# Patient Record
Sex: Male | Born: 1960 | Race: White | Hispanic: No | Marital: Married | Smoking: Never smoker
Health system: Southern US, Community
[De-identification: ages and names within clinical notes are randomized; demographics above are authoritative.]

## PROBLEM LIST (undated history)

## (undated) DIAGNOSIS — K449 Diaphragmatic hernia without obstruction or gangrene: Secondary | ICD-10-CM

## (undated) DIAGNOSIS — M1990 Unspecified osteoarthritis, unspecified site: Secondary | ICD-10-CM

## (undated) DIAGNOSIS — H269 Unspecified cataract: Secondary | ICD-10-CM

## (undated) DIAGNOSIS — I4891 Unspecified atrial fibrillation: Secondary | ICD-10-CM

## (undated) DIAGNOSIS — R000 Tachycardia, unspecified: Secondary | ICD-10-CM

## (undated) HISTORY — PX: HIATAL HERNIA REPAIR: SHX195

## (undated) HISTORY — PX: HERNIA REPAIR: SHX51

## (undated) HISTORY — DX: Tachycardia, unspecified: R00.0

## (undated) HISTORY — PX: EYE SURGERY: SHX253

## (undated) HISTORY — DX: Diaphragmatic hernia without obstruction or gangrene: K44.9

## (undated) HISTORY — DX: Unspecified osteoarthritis, unspecified site: M19.90

## (undated) HISTORY — DX: Unspecified cataract: H26.9

---

## 2018-11-23 ENCOUNTER — Ambulatory Visit: Payer: Self-pay | Admitting: Nurse Practitioner

## 2019-06-27 ENCOUNTER — Other Ambulatory Visit: Payer: Self-pay

## 2019-06-27 ENCOUNTER — Ambulatory Visit (INDEPENDENT_AMBULATORY_CARE_PROVIDER_SITE_OTHER): Payer: BC Managed Care – PPO | Admitting: Nurse Practitioner

## 2019-06-27 ENCOUNTER — Encounter: Payer: Self-pay | Admitting: Nurse Practitioner

## 2019-06-27 VITALS — BP 136/82 | HR 86 | Temp 98.0°F | Ht 72.34 in | Wt 305.6 lb

## 2019-06-27 DIAGNOSIS — B356 Tinea cruris: Secondary | ICD-10-CM | POA: Insufficient documentation

## 2019-06-27 DIAGNOSIS — Z7689 Persons encountering health services in other specified circumstances: Secondary | ICD-10-CM | POA: Diagnosis not present

## 2019-06-27 DIAGNOSIS — E669 Obesity, unspecified: Secondary | ICD-10-CM | POA: Insufficient documentation

## 2019-06-27 DIAGNOSIS — R030 Elevated blood-pressure reading, without diagnosis of hypertension: Secondary | ICD-10-CM | POA: Insufficient documentation

## 2019-06-27 DIAGNOSIS — R2243 Localized swelling, mass and lump, lower limb, bilateral: Secondary | ICD-10-CM | POA: Insufficient documentation

## 2019-06-27 DIAGNOSIS — M25561 Pain in right knee: Secondary | ICD-10-CM

## 2019-06-27 DIAGNOSIS — M25562 Pain in left knee: Secondary | ICD-10-CM

## 2019-06-27 DIAGNOSIS — G8929 Other chronic pain: Secondary | ICD-10-CM | POA: Insufficient documentation

## 2019-06-27 MED ORDER — FLUCONAZOLE 150 MG PO TABS: 150.0000 mg | ORAL_TABLET | Freq: Once | ORAL | 0 refills | Status: DC

## 2019-06-27 MED ORDER — NYSTATIN 100000 UNIT/GM EX CREA: 1.0000 "application " | TOPICAL_CREAM | Freq: Two times a day (BID) | CUTANEOUS | 1 refills | Status: DC

## 2019-06-27 MED ORDER — KETOCONAZOLE 2 % EX CREA: 1.0000 "application " | TOPICAL_CREAM | Freq: Every day | CUTANEOUS | 0 refills | Status: DC

## 2019-06-27 NOTE — Assessment & Plan Note (Addendum)
Referral to podiatry for further assessment of areas + recommendations.

## 2019-06-27 NOTE — Patient Instructions (Signed)

## 2019-06-27 NOTE — Assessment & Plan Note (Addendum)
Initial BP elevated and repeat at goal.  Recommend focus on DASH diet and modest weight loss at home.  Check BP at home at least 3 mornings a week and document.  Will obtain CMP and TSH today.  Suspect more white coat hypertension, but will monitor and initiate medication as needed.

## 2019-06-27 NOTE — Assessment & Plan Note (Addendum)
R>L with significant crepitus to right knee.  Will obtain imaging of both knees.  Recommend continued simple treatment at home with OTC medications and creams + knee brace.  Also would benefit from ice as needed.  Return to office on Friday and will perform steroid injection to right knee.  Suspect this will offer only short term benefit.  Discussed with him possible need for PT and ortho referral upcoming.

## 2019-06-27 NOTE — Assessment & Plan Note (Signed)
Tinea present.  Scripts sent for oral and topical treatment.  Recommend keeping area clean and dry as much as possible to avoid recurrent flares.  Will obtain A1C to check for diabetes due to current rash and risk factors.

## 2019-06-27 NOTE — Progress Notes (Signed)
New Patient Office Visit  Subjective:  Patient ID: Joseph [name], male    DOB: 06-13-1960  Age: 59 y.o. MRN: [id number]  CC:  Chief Complaint  Patient presents with  . Establish Care  . Knee Pain    chronic.   Marland Kitchen Recurrent Skin Infections  . Foot Swelling    with bumps on bottom of feet, non painful    HPI Joseph [name] presents for new patient visit to establish care.  Introduced to Designer, jewellery role and practice setting.  All questions answered.  Last saw Dr. Charlesetta Shanks in [place], several years ago.    KNEE PAIN Started several years ago with right knee bursitis, but recently it has become worse.  States both knees are not good, but R>L.   Been present 15 to 20 years, played football in high school.   Over the last 30 days it has become worse.  Is interested in steroid injection if able. Duration: chronic Involved knee: right Mechanism of injury: unknown Location:diffuse Onset: gradual Severity: 8/10 at worst when hurting Quality: sharp on occasion, with a constant dull pain Frequency: constant Radiation: no Aggravating factors: standing still for long while, walking, bending and movement  Alleviating factors: ice, physical therapy, APAP, NSAIDs and rest  Status: worse Treatments attempted: rest, ice, APAP and ibuprofen  Relief with NSAIDs?:  mild Weakness with weight bearing or walking: occasional at night after work Sensation of giving way: occasional at night after work Locking: yes Popping: yes Bruising: no Swelling: yes Redness: no Paresthesias/decreased sensation: no Fevers: no   SKIN INFECTION Has been present for 4 to 5 days to groin area.  Reports it as pruritic. Duration: days Location: groin History of trauma in area: no Pain: no Quality: yes Severity: mildH3 Redness: yes Swelling: no Oozing: no Pus: no Fevers: no Nausea/vomiting: no Status: fluctuating Treatments attempted:Lotrimin   BUMPS ON FEET On and off over past year, started out as  one and then gradually had more.  Present to both feet.  No difficulty putting shoes on.  Denies pain or pruritus to bumps on feet.   Duration: chronic Involved foot: bilateral Frequency: constant Radiation: no Status: stable Treatments attempted: none Morning stiffness: yes Swelling: yes Redness: no Bruising: no Paresthesias / decreased sensation: no  Fevers:no  Past Medical History:  Diagnosis Date  . Hernia, hiatal    20 years ago    Past Surgical History:  Procedure Laterality Date  . HIATAL HERNIA REPAIR      Family History  Problem Relation Age of Onset  . Leukemia Father   . COPD Sister     Social History   Socioeconomic History  . Marital status: Married    Spouse name: Not on file  . Number of children: Not on file  . Years of education: Not on file  . Highest education level: Not on file  Occupational History    Comment: [employer]  Tobacco Use  . Smoking status: Never Smoker  . Smokeless tobacco: Current User    Types: Chew  Substance and Sexual Activity  . Alcohol use: Yes  . Drug use: Not on file  . Sexual activity: Yes  Other Topics Concern  . Not on file  Social History Narrative  . Not on file   Social Determinants of Health   Financial Resource Strain: Unknown  . Difficulty of Paying Living Expenses: Patient refused  Food Insecurity: No Food Insecurity  . Worried About Charity fundraiser in the Last Year:  Never true  . Ran Out of Food in the Last Year: Never true  Transportation Needs: No Transportation Needs  . Lack of Transportation (Medical): No  . Lack of Transportation (Non-Medical): No  Physical Activity: Insufficiently Active  . Days of Exercise per Week: 3 days  . Minutes of Exercise per Session: 30 min  Stress: No Stress Concern Present  . Feeling of Stress : Only a little  Social Connections: Slightly Isolated  . Frequency of Communication with Friends and Family: Three times a week  . Frequency of Social  Gatherings with Friends and Family: Three times a week  . Attends Religious Services: More than 4 times per year  . Active Member of Clubs or Organizations: No  . Attends Archivist Meetings: Never  . Marital Status: Married  Human resources officer Violence:   . Fear of Current or Ex-Partner: Not on file  . Emotionally Abused: Not on file  . Physically Abused: Not on file  . Sexually Abused: Not on file    ROS Review of Systems  Constitutional: Negative for activity change, diaphoresis, fatigue and fever.  Respiratory: Negative for cough, chest tightness, shortness of breath and wheezing.   Cardiovascular: Negative for chest pain, palpitations and leg swelling.  Gastrointestinal: Negative.   Endocrine: Negative for cold intolerance, heat intolerance, polydipsia, polyphagia and polyuria.  Musculoskeletal: Positive for arthralgias.  Skin: Positive for rash.  Neurological: Negative.   Psychiatric/Behavioral: Negative.     Objective:   Today's Vitals: BP 136/82 (BP Location: Right Arm, Patient Position: Sitting, Cuff Size: Large)   Pulse 86   Temp 98 F (36.7 C) (Oral)   Ht 6' 0.34" (1.837 m)   Wt (!) 305 lb 9.6 oz (138.6 kg)   SpO2 93%   BMI 41.05 kg/m   Physical Exam Vitals and nursing note reviewed.  Constitutional:      General: He is awake. He is not in acute distress.    Appearance: He is well-developed. He is morbidly obese. He is not ill-appearing.  HENT:     Head: Normocephalic and atraumatic.     Right Ear: Hearing normal. No drainage.     Left Ear: Hearing normal. No drainage.  Eyes:     General: Lids are normal.        Right eye: No discharge.        Left eye: No discharge.     Conjunctiva/sclera: Conjunctivae normal.     Pupils: Pupils are equal, round, and reactive to light.  Neck:     Thyroid: No thyromegaly.     Vascular: No carotid bruit.  Cardiovascular:     Rate and Rhythm: Normal rate and regular rhythm.     Pulses:          Dorsalis pedis  pulses are 2+ on the right side and 2+ on the left side.       Posterior tibial pulses are 2+ on the right side and 2+ on the left side.     Heart sounds: Normal heart sounds, S1 normal and S2 normal. No murmur. No gallop.   Pulmonary:     Effort: Pulmonary effort is normal. No accessory muscle usage or respiratory distress.     Breath sounds: Normal breath sounds.  Abdominal:     General: Bowel sounds are normal.     Palpations: Abdomen is soft.     Tenderness: There is no abdominal tenderness.  Musculoskeletal:        General: Normal range of motion.  Cervical back: Normal range of motion and neck supple.     Right knee: Swelling (mild to lateral) and crepitus (significant) present. No deformity, erythema or lacerations. Normal range of motion. No tenderness.     Instability Tests: Negative medial McMurray test and negative lateral McMurray test.     Left knee: No swelling, deformity, erythema, lacerations or crepitus. Normal range of motion. No tenderness.     Instability Tests: Negative medial McMurray test and negative lateral McMurray test.     Right lower leg: No edema.     Left lower leg: No edema.  Feet:     Right foot:     Protective Sensation: 10 sites tested. 10 sites sensed.     Skin integrity: Dry skin present.     Toenail Condition: Right toenails are abnormally thick.     Left foot:     Protective Sensation: 10 sites tested. 10 sites sensed.     Skin integrity: Dry skin present.     Toenail Condition: Left toenails are abnormally thick.     Comments: Small, raised cyst-like masses noted to lateral aspect bilateral feet (2 on right and 3 on left) + under bilateral great toes.  Areas with no erythema, warmth, or tenderness.  Firm to touch.   Skin:    General: Skin is warm and dry.     Capillary Refill: Capillary refill takes less than 2 seconds.     Findings: Rash present.     Comments: Groin area with moderate erythema to bilateral inner upper inner thigh and groin,  R>L.  No drainage.  Skin intact.  No odor.  Neurological:     Mental Status: He is alert and oriented to person, place, and time.     Deep Tendon Reflexes: Reflexes are normal and symmetric.  Psychiatric:        Attention and Perception: Attention normal.        Mood and Affect: Mood normal.        Speech: Speech normal.        Behavior: Behavior normal. Behavior is cooperative.        Thought Content: Thought content normal.        Judgment: Judgment normal.     Assessment & Plan:   Problem List Items Addressed This Visit      Musculoskeletal and Integument   Tinea cruris    Tinea present.  Scripts sent for oral and topical treatment.  Recommend keeping area clean and dry as much as possible to avoid recurrent flares.  Will obtain A1C to check for diabetes due to current rash and risk factors.        Relevant Medications   fluconazole (DIFLUCAN) 150 MG tablet   nystatin cream (MYCOSTATIN)   Other Relevant Orders   HgB A1c     Other   Morbid obesity ([place])    Recommend continued focus on healthy diet choices and regular physical activity (30 minutes 5 days a week).  Focus on small, achievable goals at a time.        Subcutaneous mass of both feet    Referral to podiatry for further assessment of areas + recommendations.      Relevant Orders   Ambulatory referral to Podiatry   Elevated BP without diagnosis of hypertension    Initial BP elevated and repeat at goal.  Recommend focus on DASH diet and modest weight loss at home.  Check BP at home at least 3 mornings a week and document.  Will obtain CMP and TSH today.  Suspect more white coat hypertension, but will monitor and initiate medication as needed.      Relevant Orders   Comprehensive metabolic panel   TSH   Chronic pain of both knees    R>L with significant crepitus to right knee.  Will obtain imaging of both knees.  Recommend continued simple treatment at home with OTC medications and creams + knee brace.  Also  would benefit from ice as needed.  Return to office on Friday and will perform steroid injection to right knee.  Suspect this will offer only short term benefit.  Discussed with him possible need for PT and ortho referral upcoming.      Relevant Medications   acetaminophen (TYLENOL) 500 MG tablet   ibuprofen (ADVIL) 600 MG tablet   Other Relevant Orders   DG Knee Complete 4 Views Right   DG Knee Complete 4 Views Left    Other Visit Diagnoses    Encounter to establish care    -  Primary      Outpatient Encounter Medications as of 06/27/2019  Medication Sig  . acetaminophen (TYLENOL) 500 MG tablet Take 500 mg by mouth every 6 (six) hours as needed.  Marland Kitchen ibuprofen (ADVIL) 600 MG tablet Take 600 mg by mouth every 6 (six) hours as needed.  . fluconazole (DIFLUCAN) 150 MG tablet Take 1 tablet (150 mg total) by mouth once for 1 dose.  . nystatin cream (MYCOSTATIN) Apply 1 application topically 2 (two) times daily. To affected areas.  . [DISCONTINUED] ketoconazole (NIZORAL) 2 % cream Apply 1 application topically daily.   No facility-administered encounter medications on file as of 06/27/2019.    Follow-up: Return in about 4 days (around 07/01/2019) for Knee injection.   Venita Lick, NP

## 2019-06-27 NOTE — Assessment & Plan Note (Signed)
Recommend continued focus on healthy diet choices and regular physical activity (30 minutes 5 days a week).  Focus on small, achievable goals at a time.

## 2019-06-28 LAB — COMPREHENSIVE METABOLIC PANEL
ALT: 43 IU/L (ref 0–44)
AST: 48 IU/L — ABNORMAL HIGH (ref 0–40)
Albumin/Globulin Ratio: 1.9 (ref 1.2–2.2)
Albumin: 4.4 g/dL (ref 3.8–4.9)
Alkaline Phosphatase: 77 IU/L (ref 39–117)
BUN/Creatinine Ratio: 11 (ref 9–20)
BUN: 11 mg/dL (ref 6–24)
Bilirubin Total: 0.5 mg/dL (ref 0.0–1.2)
CO2: 24 mmol/L (ref 20–29)
Calcium: 9 mg/dL (ref 8.7–10.2)
Chloride: 104 mmol/L (ref 96–106)
Creatinine, Ser: 0.96 mg/dL (ref 0.76–1.27)
GFR calc Af Amer: 100 mL/min/{1.73_m2} (ref >59)
GFR calc non Af Amer: 87 mL/min/{1.73_m2} (ref >59)
Globulin, Total: 2.3 g/dL (ref 1.5–4.5)
Glucose: 101 mg/dL — ABNORMAL HIGH (ref 65–99)
Potassium: 3.9 mmol/L (ref 3.5–5.2)
Sodium: 142 mmol/L (ref 134–144)
Total Protein: 6.7 g/dL (ref 6.0–8.5)

## 2019-06-28 LAB — HEMOGLOBIN A1C
Est. average glucose Bld gHb Est-mCnc: 91 mg/dL
Hgb A1c MFr Bld: 4.8 % (ref 4.8–5.6)

## 2019-06-28 LAB — TSH: TSH: 3.85 u[IU]/mL (ref 0.450–4.500)

## 2019-06-28 NOTE — Progress Notes (Signed)
Left general HIPAA compliant message.  If he calls back please let him know labs overall are within normal range and A1C shows no diabetes.  Thyroid is normal.  Kidney and liver function at good levels.

## 2019-06-30 ENCOUNTER — Ambulatory Visit
Admission: RE | Admit: 2019-06-30 | Discharge: 2019-06-30 | Disposition: A | Discharge status: Home / Self Care | Payer: BC Managed Care – PPO | Source: Ambulatory Visit | Attending: Nurse Practitioner | Admitting: Nurse Practitioner

## 2019-06-30 ENCOUNTER — Telehealth: Payer: Self-pay | Admitting: Nurse Practitioner

## 2019-06-30 ENCOUNTER — Other Ambulatory Visit: Payer: Self-pay

## 2019-06-30 DIAGNOSIS — G8929 Other chronic pain: Secondary | ICD-10-CM

## 2019-06-30 DIAGNOSIS — M25562 Pain in left knee: Secondary | ICD-10-CM

## 2019-06-30 DIAGNOSIS — M1711 Unilateral primary osteoarthritis, right knee: Secondary | ICD-10-CM | POA: Diagnosis not present

## 2019-06-30 DIAGNOSIS — M25561 Pain in right knee: Secondary | ICD-10-CM | POA: Insufficient documentation

## 2019-06-30 DIAGNOSIS — M25462 Effusion, left knee: Secondary | ICD-10-CM | POA: Diagnosis not present

## 2019-06-30 NOTE — Telephone Encounter (Signed)
Patient is calling to ask advice regarding his appt for cortisone shot tomorrow- 07/01/19- Patient would like to know if he would be able to drive himself after the appt. Please advise CB- [phone]

## 2019-06-30 NOTE — Telephone Encounter (Signed)
Jolene gone for the rest of the day and I spoke with Dr. Wynetta Emery. Explained patient's are usually okay but it can be a little sore afterwards. Explained to patient that it was up to him whether he wanted someone to bring him tomorrow or not.  Patient verbalized understanding.

## 2019-07-01 ENCOUNTER — Other Ambulatory Visit: Payer: Self-pay

## 2019-07-01 ENCOUNTER — Ambulatory Visit: Payer: BC Managed Care – PPO | Admitting: Nurse Practitioner

## 2019-07-01 ENCOUNTER — Encounter: Payer: Self-pay | Admitting: Nurse Practitioner

## 2019-07-01 VITALS — BP 156/96 | HR 83 | Temp 99.3°F

## 2019-07-01 DIAGNOSIS — G8929 Other chronic pain: Secondary | ICD-10-CM

## 2019-07-01 DIAGNOSIS — M25561 Pain in right knee: Secondary | ICD-10-CM

## 2019-07-01 DIAGNOSIS — M25562 Pain in left knee: Secondary | ICD-10-CM | POA: Diagnosis not present

## 2019-07-01 MED ORDER — TRIAMCINOLONE ACETONIDE 40 MG/ML IJ SUSP
40.0000 mg | Freq: Once | INTRAMUSCULAR | Status: AC
  Administered 2019-07-01 (×2): 40 mg via INTRAMUSCULAR

## 2019-07-01 NOTE — Progress Notes (Signed)
BP (!) 156/96 (BP Location: Left Arm, Cuff Size: Normal)   Pulse 83   Temp 99.3 F (37.4 C) (Oral)   SpO2 94%    Subjective:    Patient ID: Joseph [name], male    DOB: June 03, 1961, 59 y.o.   MRN: [id number]  HPI: Joseph [name] is a 59 y.o. male  Chief Complaint  Patient presents with  . Knee Pain    bilateral, here for knee injection. Right hurts worse than left.    KNEE PAIN Started several years ago with right knee bursitis, but recently it has become worse.  States both knees are not good, but R>L.   Been present 15 to 20 years, played football in high school.   Over the last 30 days it has become worse.  Is interested in steroid injection if able.  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  "Advanced medial joint space degenerative change with bone on bone appearance" in right knee. Duration: chronic Involved knee: right Mechanism of injury: unknown Location:diffuse Onset: gradual Severity: 8/10 at worst when hurting Quality: sharp on occasion, with a constant dull pain Frequency: constant Radiation: no Aggravating factors: standing still for long while, walking, bending and movement  Alleviating factors: ice, APAP, NSAIDs and rest  Status: worse Treatments attempted: rest, ice, APAP and ibuprofen  Relief with NSAIDs?:  mild Weakness with weight bearing or walking: occasional at night after work Sensation of giving way: occasional at night after work Locking: yes Popping: yes Bruising: no Swelling: yes Redness: no Paresthesias/decreased sensation: no Fevers: no   Relevant past medical, surgical, family and social history reviewed and updated as indicated. Interim medical history since our last visit reviewed. Allergies and medications reviewed and updated.  Review of Systems  Constitutional: Negative for activity change, diaphoresis, fatigue and fever.    Respiratory: Negative for cough, chest tightness, shortness of breath and wheezing.   Cardiovascular: Negative for chest pain, palpitations and leg swelling.  Gastrointestinal: Negative.   Musculoskeletal: Positive for arthralgias.  Neurological: Negative.   Psychiatric/Behavioral: Negative.     Per HPI unless specifically indicated above     Objective:    BP (!) 156/96 (BP Location: Left Arm, Cuff Size: Normal)   Pulse 83   Temp 99.3 F (37.4 C) (Oral)   SpO2 94%   Wt Readings from Last 3 Encounters:  06/27/19 (!) 305 lb 9.6 oz (138.6 kg)    Physical Exam Vitals and nursing note reviewed.  Constitutional:      General: He is awake. He is not in acute distress.    Appearance: He is well-developed. He is morbidly obese. He is not ill-appearing.  HENT:     Head: Normocephalic and atraumatic.     Right Ear: Hearing normal. No drainage.     Left Ear: Hearing normal. No drainage.  Eyes:     General: Lids are normal.        Right eye: No discharge.        Left eye: No discharge.     Conjunctiva/sclera: Conjunctivae normal.     Pupils: Pupils are equal, round, and reactive to light.  Neck:     Thyroid: No thyromegaly.     Vascular: No carotid bruit.  Cardiovascular:     Rate and Rhythm: Normal rate and regular rhythm.     Heart sounds: Normal heart sounds, S1 normal and S2 normal. No murmur. No gallop.  Pulmonary:     Effort: Pulmonary effort is normal. No accessory muscle usage or respiratory distress.     Breath sounds: Normal breath sounds.  Abdominal:     General: Bowel sounds are normal.     Palpations: Abdomen is soft.     Tenderness: There is no abdominal tenderness.  Musculoskeletal:        General: Normal range of motion.     Cervical back: Normal range of motion and neck supple.     Right knee: Swelling (mild to lateral) and crepitus (significant) present. No deformity, erythema or lacerations. Normal range of motion. No tenderness.     Instability Tests:  Negative medial McMurray test and negative lateral McMurray test.     Left knee: No swelling, deformity, erythema, lacerations or crepitus. Normal range of motion. No tenderness.     Instability Tests: Negative medial McMurray test and negative lateral McMurray test.     Right lower leg: No edema.     Left lower leg: No edema.  Skin:    General: Skin is warm and dry.  Neurological:     Mental Status: He is alert and oriented to person, place, and time.     Deep Tendon Reflexes: Reflexes are normal and symmetric.  Psychiatric:        Attention and Perception: Attention normal.        Mood and Affect: Mood normal.        Speech: Speech normal.        Behavior: Behavior normal. Behavior is cooperative.        Thought Content: Thought content normal.        Judgment: Judgment normal.     STEROID INJECTION Procedure: Knee Intraarticular Steroid Injection right knee Description: After verbal consent and patient education on procedure, area prepped and draped using semi-sterile technique. Using a anterior approach, a mixture of 4 cc of  1% Marcaine & 1 cc of Kenalog 40 was injected into knee joint.  A bandage was then placed over the injection site. Complications:  none Post Procedure Instructions: To the ER if any symptoms of erythema or swelling.   Follow Up: PRN  Results for orders placed or performed in visit on 06/27/19  Comprehensive metabolic panel  Result Value Ref Range   Glucose 101 (H) 65 - 99 mg/dL   BUN 11 6 - 24 mg/dL   Creatinine, Ser 0.96 0.76 - 1.27 mg/dL   GFR calc non Af Amer 87 >59 mL/min/1.73   GFR calc Af Amer 100 >59 mL/min/1.73   BUN/Creatinine Ratio 11 9 - 20   Sodium 142 134 - 144 mmol/L   Potassium 3.9 3.5 - 5.2 mmol/L   Chloride 104 96 - 106 mmol/L   CO2 24 20 - 29 mmol/L   Calcium 9.0 8.7 - 10.2 mg/dL   Total Protein 6.7 6.0 - 8.5 g/dL   Albumin 4.4 3.8 - 4.9 g/dL   Globulin, Total 2.3 1.5 - 4.5 g/dL   Albumin/Globulin Ratio 1.9 1.2 - 2.2   Bilirubin  Total 0.5 0.0 - 1.2 mg/dL   Alkaline Phosphatase 77 39 - 117 IU/L   AST 48 (H) 0 - 40 IU/L   [name] 43 0 - 44 IU/L  TSH  Result Value Ref Range   TSH 3.850 0.450 - 4.500 uIU/mL  HgB A1c  Result Value Ref Range   Hgb A1c MFr Bld 4.8 4.8 - 5.6 %   Est. average glucose Bld gHb Est-mCnc 91 mg/dL  Assessment & Plan:   Problem List Items Addressed This Visit      Other   Chronic pain of both knees - Primary    Ongoing.  Steroid injection to right knee performed and given strict instructions on post injection care and when to go to ER.  Recommend continued simple treatment at home with OTC medications and creams + knee brace.  Also would benefit from ice as needed.  Return to office in 6 weeks for physical.            Follow up plan: Return in about 6 weeks (around 08/12/2019) for Annual physical.

## 2019-07-01 NOTE — Patient Instructions (Signed)
Knee Injection A knee injection is a procedure to get medicine into your knee joint to relieve the pain, swelling, and stiffness of arthritis. Your health care provider uses a needle to inject medicine, which may also help to lubricate and cushion your knee joint. You may need more than one injection. Tell a health care provider about:  Any allergies you have.  All medicines you are taking, including vitamins, herbs, eye drops, creams, and over-the-counter medicines.  Any problems you or family members have had with anesthetic medicines.  Any blood disorders you have.  Any surgeries you have had.  Any medical conditions you have.  Whether you are pregnant or may be pregnant. What are the risks? Generally, this is a safe procedure. However, problems may occur, including:  Infection.  Bleeding.  Symptoms that get worse.  Damage to the area around your knee.  Allergic reaction to any of the medicines.  Skin reactions from repeated injections. What happens before the procedure?  Ask your health care provider about changing or stopping your regular medicines. This is especially important if you are taking diabetes medicines or blood thinners.  Plan to have someone take you home from the hospital or clinic. What happens during the procedure?   You will sit or lie down in a position for your knee to be treated.  The skin over your kneecap will be cleaned with a germ-killing soap.  You will be given a medicine that numbs the area (local anesthetic). You may feel some stinging.  The medicine will be injected into your knee. The needle is carefully placed between your kneecap and your knee. The medicine is injected into the joint space.  The needle will be removed at the end of the procedure.  A bandage (dressing) may be placed over the injection site. The procedure may vary among health care providers and hospitals. What can I expect after the procedure?  Your blood  pressure, heart rate, breathing rate, and blood oxygen level will be monitored until you leave the hospital or clinic.  You may have to move your knee through its full range of motion. This helps to get all the medicine into your joint space.  You will be watched to make sure that you do not have a reaction to the injected medicine.  You may feel more pain, swelling, and warmth than you did before the injection. This reaction may last about 1-2 days. Follow these instructions at home: Medicines  Take over-the-counter and prescription medicines only as told by your doctor.  Do not drive or use heavy machinery while taking prescription pain medicine.  Do not take medicines such as aspirin and ibuprofen unless your health care provider tells you to take them. Injection site care  Follow instructions from your health care provider about: ? How to take care of your puncture site. ? When and how you should change your dressing. ? When you should remove your dressing.  Check your injection area every day for signs of infection. Check for: ? More redness, swelling, or pain after 2 days. ? Fluid or blood. ? Pus or a bad smell. ? Warmth. Managing pain, stiffness, and swelling   If directed, put ice on the injection area: ? Put ice in a plastic bag. ? Place a towel between your skin and the bag. ? Leave the ice on for 20 minutes, 2-3 times per day.  Do not apply heat to your knee.  Raise (elevate) the injection area above the level   of your heart while you are sitting or lying down. General instructions  If you were given a dressing, keep it dry until your health care provider says it can be removed. Ask your health care provider when you can start showering or taking a bath.  Avoid strenuous activities for as long as directed by your health care provider. Ask your health care provider when you can return to your normal activities.  Keep all follow-up visits as told by your health  care provider. This is important. You may need more injections. Contact a health care provider if you have:  A fever.  Warmth in your injection area.  Fluid, blood, or pus coming from your injection site.  Symptoms at your injection site that last longer than 2 days after your procedure. Get help right away if:  Your knee: ? Turns very red. ? Becomes very swollen. ? Is in severe pain. Summary  A knee injection is a procedure to get medicine into your knee joint to relieve the pain, swelling, and stiffness of arthritis.  A needle is carefully placed between your kneecap and your knee to inject medicine into the joint space.  Before the procedure, ask your health care provider about changing or stopping your regular medicines, especially if you are taking diabetes medicines or blood thinners.  Contact your health care provider if you have any problems or questions after your procedure. This information is not intended to replace advice given to you by your health care provider. Make sure you discuss any questions you have with your health care provider. Document Revised: 06/15/2017 Document Reviewed: 06/15/2017 Elsevier Patient Education  2020 Elsevier Inc.  

## 2019-07-01 NOTE — Assessment & Plan Note (Signed)
Ongoing.  Steroid injection to right knee performed and given strict instructions on post injection care and when to go to ER.  Recommend continued simple treatment at home with OTC medications and creams + knee brace.  Also would benefit from ice as needed.  Return to office in 6 weeks for physical.

## 2019-07-15 ENCOUNTER — Ambulatory Visit: Payer: BC Managed Care – PPO | Admitting: Podiatry

## 2019-08-04 ENCOUNTER — Encounter: Payer: Self-pay | Admitting: Nurse Practitioner

## 2019-08-05 ENCOUNTER — Other Ambulatory Visit: Payer: Self-pay | Admitting: Nurse Practitioner

## 2019-08-05 MED ORDER — NYSTATIN 100000 UNIT/GM EX CREA: 1.0000 "application " | TOPICAL_CREAM | Freq: Two times a day (BID) | CUTANEOUS | 1 refills | Status: DC

## 2019-08-05 MED ORDER — FLUCONAZOLE 150 MG PO TABS: 150.0000 mg | ORAL_TABLET | Freq: Once | ORAL | 0 refills | Status: AC

## 2019-08-12 ENCOUNTER — Other Ambulatory Visit: Payer: Self-pay

## 2019-08-12 ENCOUNTER — Encounter: Payer: Self-pay | Admitting: Nurse Practitioner

## 2019-08-12 ENCOUNTER — Ambulatory Visit (INDEPENDENT_AMBULATORY_CARE_PROVIDER_SITE_OTHER): Payer: BC Managed Care – PPO | Admitting: Nurse Practitioner

## 2019-08-12 VITALS — BP 128/72 | HR 75 | Temp 98.5°F | Ht 71.7 in | Wt 297.8 lb

## 2019-08-12 DIAGNOSIS — Z0000 Encounter for general adult medical examination without abnormal findings: Secondary | ICD-10-CM

## 2019-08-12 DIAGNOSIS — R030 Elevated blood-pressure reading, without diagnosis of hypertension: Secondary | ICD-10-CM | POA: Diagnosis not present

## 2019-08-12 DIAGNOSIS — G8929 Other chronic pain: Secondary | ICD-10-CM

## 2019-08-12 DIAGNOSIS — Z23 Encounter for immunization: Secondary | ICD-10-CM | POA: Diagnosis not present

## 2019-08-12 DIAGNOSIS — Z1211 Encounter for screening for malignant neoplasm of colon: Secondary | ICD-10-CM | POA: Diagnosis not present

## 2019-08-12 DIAGNOSIS — M25562 Pain in left knee: Secondary | ICD-10-CM

## 2019-08-12 DIAGNOSIS — M25561 Pain in right knee: Secondary | ICD-10-CM

## 2019-08-12 NOTE — Assessment & Plan Note (Signed)
Ortho referral placed per patient request.  Right knee function and pain has improved post steroid injection, but would like to discuss future knees since bone on bone noted on imaging.

## 2019-08-12 NOTE — Patient Instructions (Addendum)
https://www.cdc.gov/vaccines/hcp/vis/vis-statements/tdap.pdf">  Tdap (Tetanus, Diphtheria, Pertussis) Vaccine: What You Need to Know 1. Why get vaccinated? Tdap vaccine can prevent tetanus, diphtheria, and pertussis. Diphtheria and pertussis spread from person to person. Tetanus enters the body through cuts or wounds.  TETANUS (T) causes painful stiffening of the muscles. Tetanus can lead to serious health problems, including being unable to open the mouth, having trouble swallowing and breathing, or death.  DIPHTHERIA (D) can lead to difficulty breathing, heart failure, paralysis, or death.  PERTUSSIS (aP), also known as "whooping cough," can cause uncontrollable, violent coughing which makes it hard to breathe, eat, or drink. Pertussis can be extremely serious in babies and young children, causing pneumonia, convulsions, brain damage, or death. In teens and adults, it can cause weight loss, loss of bladder control, passing out, and rib fractures from severe coughing. 2. Tdap vaccine Tdap is only for children 7 years and older, adolescents, and adults.  Adolescents should receive a single dose of Tdap, preferably at age 53 or 35 years. Pregnant women should get a dose of Tdap during every pregnancy, to protect the newborn from pertussis. Infants are most at risk for severe, life-threatening complications from pertussis. Adults who have never received Tdap should get a dose of Tdap. Also, adults should receive a booster dose every 10 years, or earlier in the case of a severe and dirty wound or burn. Booster doses can be either Tdap or Td (a different vaccine that protects against tetanus and diphtheria but not pertussis). Tdap may be given at the same time as other vaccines. 3. Talk with your health care provider Tell your vaccine provider if the person getting the vaccine:  Has had an allergic reaction after a previous dose of any vaccine that protects against tetanus, diphtheria, or pertussis,  or has any severe, life-threatening allergies.  Has had a coma, decreased level of consciousness, or prolonged seizures within 7 days after a previous dose of any pertussis vaccine (DTP, DTaP, or Tdap).  Has seizures or another nervous system problem.  Has ever had Guillain-Barr Syndrome (also called GBS).  Has had severe pain or swelling after a previous dose of any vaccine that protects against tetanus or diphtheria. In some cases, your health care provider may decide to postpone Tdap vaccination to a future visit.  People with minor illnesses, such as a cold, may be vaccinated. People who are moderately or severely ill should usually wait until they recover before getting Tdap vaccine.  Your health care provider can give you more information. 4. Risks of a vaccine reaction  Pain, redness, or swelling where the shot was given, mild fever, headache, feeling tired, and nausea, vomiting, diarrhea, or stomachache sometimes happen after Tdap vaccine. People sometimes faint after medical procedures, including vaccination. Tell your provider if you feel dizzy or have vision changes or ringing in the ears.  As with any medicine, there is a very remote chance of a vaccine causing a severe allergic reaction, other serious injury, or death. 5. What if there is a serious problem? An allergic reaction could occur after the vaccinated person leaves the clinic. If you see signs of a severe allergic reaction (hives, swelling of the face and throat, difficulty breathing, a fast heartbeat, dizziness, or weakness), call 9-1-1 and get the person to the nearest hospital. For other signs that concern you, call your health care provider.  Adverse reactions should be reported to the Vaccine Adverse Event Reporting System (VAERS). Your health care provider will usually file this report,  or you can do it yourself. Visit the VAERS website at www.vaers.SamedayNews.es or call 541-765-9292. VAERS is only for reporting  reactions, and VAERS staff do not give medical advice. 6. The National Vaccine Injury Compensation Program The [employer] Vaccine Injury Compensation Program (VICP) is a federal program that was created to compensate people who may have been injured by certain vaccines. Visit the VICP website at GoldCloset.com.ee or call 7205699415 to learn about the program and about filing a claim. There is a time limit to file a claim for compensation. 7. How can I learn more?  Ask your health care provider.  Call your local or state health department.  Contact the Centers for Disease Control and Prevention (CDC): ? Call (647)819-7925 (1-800-CDC-INFO) or ? Visit CDC's website at http://hunter.com/ Vaccine Information Statement Tdap (Tetanus, Diphtheria, Pertussis) Vaccine (09/08/2018) This information is not intended to replace advice given to you by your health care provider. Make sure you discuss any questions you have with your health care provider. Document Revised: 09/17/2018 Document Reviewed: 09/20/2018 Elsevier Patient Education  [place]. Influenza (Flu) Vaccine (Inactivated or Recombinant): What You Need to Know 1. Why get vaccinated? Influenza vaccine can prevent influenza (flu). Flu is a contagious disease that spreads around the [place] every year, usually between October and May. Anyone can get the flu, but it is more dangerous for some people. Infants and young children, people 96 years of age and older, pregnant women, and people with certain health conditions or a weakened immune system are at greatest risk of flu complications. Pneumonia, bronchitis, sinus infections and ear infections are examples of flu-related complications. If you have a medical condition, such as heart disease, cancer or diabetes, flu can make it worse. Flu can cause fever and chills, sore throat, muscle aches, fatigue, cough, headache, and runny or stuffy nose. Some people may have  vomiting and diarrhea, though this is more common in children than adults. Each year thousands of people in the [place] die from flu, and many more are hospitalized. Flu vaccine prevents millions of illnesses and flu-related visits to the doctor each year. 2. Influenza vaccine CDC recommends everyone 66 months of age and older get vaccinated every flu season. Children 6 months through 76 years of age may need 2 doses during a single flu season. Everyone else needs only 1 dose each flu season. It takes about 2 weeks for protection to develop after vaccination. There are many flu viruses, and they are always changing. Each year a new flu vaccine is made to protect against three or four viruses that are likely to cause disease in the upcoming flu season. Even when the vaccine doesn't exactly match these viruses, it may still provide some protection. Influenza vaccine does not cause flu. Influenza vaccine may be given at the same time as other vaccines. 3. Talk with your health care provider Tell your vaccine provider if the person getting the vaccine:  Has had an allergic reaction after a previous dose of influenza vaccine, or has any severe, life-threatening allergies.  Has ever had Guillain-Barr Syndrome (also called GBS). In some cases, your health care provider may decide to postpone influenza vaccination to a future visit. People with minor illnesses, such as a cold, may be vaccinated. People who are moderately or severely ill should usually wait until they recover before getting influenza vaccine. Your health care provider can give you more information. 4. Risks of a vaccine reaction  Soreness, redness, and swelling where shot is given,  fever, muscle aches, and headache can happen after influenza vaccine.  There may be a very small increased risk of Guillain-Barr Syndrome (GBS) after inactivated influenza vaccine (the flu shot). Young children who get the flu shot along with  pneumococcal vaccine (PCV13), and/or DTaP vaccine at the same time might be slightly more likely to have a seizure caused by fever. Tell your health care provider if a child who is getting flu vaccine has ever had a seizure. People sometimes faint after medical procedures, including vaccination. Tell your provider if you feel dizzy or have vision changes or ringing in the ears. As with any medicine, there is a very remote chance of a vaccine causing a severe allergic reaction, other serious injury, or death. 5. What if there is a serious problem? An allergic reaction could occur after the vaccinated person leaves the clinic. If you see signs of a severe allergic reaction (hives, swelling of the face and throat, difficulty breathing, a fast heartbeat, dizziness, or weakness), call 9-1-1 and get the person to the nearest hospital. For other signs that concern you, call your health care provider. Adverse reactions should be reported to the Vaccine Adverse Event Reporting System (VAERS). Your health care provider will usually file this report, or you can do it yourself. Visit the VAERS website at www.vaers.SamedayNews.es or call 217-497-3826.VAERS is only for reporting reactions, and VAERS staff do not give medical advice. 6. The National Vaccine Injury Compensation Program The [employer] Vaccine Injury Compensation Program (VICP) is a federal program that was created to compensate people who may have been injured by certain vaccines. Visit the VICP website at GoldCloset.com.ee or call 989-742-8539 to learn about the program and about filing a claim. There is a time limit to file a claim for compensation. 7. How can I learn more?  Ask your healthcare provider.  Call your local or state health department.  Contact the Centers for Disease Control and Prevention (CDC): ? Call (680) 448-6492 (1-800-CDC-INFO) or ? Visit CDC's https://gibson.com/ Vaccine Information Statement (Interim) Inactivated  Influenza Vaccine (01/21/2018) This information is not intended to replace advice given to you by your health care provider. Make sure you discuss any questions you have with your health care provider. Document Revised: 09/14/2018 Document Reviewed: 01/25/2018 Elsevier Patient Education  [place] Maintenance, Male Adopting a healthy lifestyle and getting preventive care are important in promoting health and wellness. Ask your health care provider about:  The right schedule for you to have regular tests and exams.  Things you can do on your own to prevent diseases and keep yourself healthy. What should I know about diet, weight, and exercise? Eat a healthy diet   Eat a diet that includes plenty of vegetables, fruits, low-fat dairy products, and lean protein.  Do not eat a lot of foods that are high in solid fats, added sugars, or sodium. Maintain a healthy weight Body mass index (BMI) is a measurement that can be used to identify possible weight problems. It estimates body fat based on height and weight. Your health care provider can help determine your BMI and help you achieve or maintain a healthy weight. Get regular exercise Get regular exercise. This is one of the most important things you can do for your health. Most adults should:  Exercise for at least 150 minutes each week. The exercise should increase your heart rate and make you sweat (moderate-intensity exercise).  Do strengthening exercises at least twice a week. This is in addition to  the moderate-intensity exercise.  Spend less time sitting. Even light physical activity can be beneficial. Watch cholesterol and blood lipids Have your blood tested for lipids and cholesterol at 59 years of age, then have this test every 5 years. You may need to have your cholesterol levels checked more often if:  Your lipid or cholesterol levels are high.  You are older than 59 years of age.  You are at high risk for  heart disease. What should I know about cancer screening? Many types of cancers can be detected early and may often be prevented. Depending on your health history and family history, you may need to have cancer screening at various ages. This may include screening for:  Colorectal cancer.  Prostate cancer.  Skin cancer.  Lung cancer. What should I know about heart disease, diabetes, and high blood pressure? Blood pressure and heart disease  High blood pressure causes heart disease and increases the risk of stroke. This is more likely to develop in people who have high blood pressure readings, are of African descent, or are overweight.  Talk with your health care provider about your target blood pressure readings.  Have your blood pressure checked: ? Every 3-5 years if you are 36-26 years of age. ? Every year if you are 72 years old or older.  If you are between the ages of 37 and 31 and are a current or former smoker, ask your health care provider if you should have a one-time screening for abdominal aortic aneurysm (AAA). Diabetes Have regular diabetes screenings. This checks your fasting blood sugar level. Have the screening done:  Once every three years after age 50 if you are at a normal weight and have a low risk for diabetes.  More often and at a younger age if you are overweight or have a high risk for diabetes. What should I know about preventing infection? Hepatitis B If you have a higher risk for hepatitis B, you should be screened for this virus. Talk with your health care provider to find out if you are at risk for hepatitis B infection. Hepatitis C Blood testing is recommended for:  Everyone born from [birth year] through 1965.  Anyone with known risk factors for hepatitis C. Sexually transmitted infections (STIs)  You should be screened each year for STIs, including gonorrhea and chlamydia, if: ? You are sexually active and are younger than 59 years of age. ? You are  older than 59 years of age and your health care provider tells you that you are at risk for this type of infection. ? Your sexual activity has changed since you were last screened, and you are at increased risk for chlamydia or gonorrhea. Ask your health care provider if you are at risk.  Ask your health care provider about whether you are at high risk for HIV. Your health care provider may recommend a prescription medicine to help prevent HIV infection. If you choose to take medicine to prevent HIV, you should first get tested for HIV. You should then be tested every 3 months for as long as you are taking the medicine. Follow these instructions at home: Lifestyle  Do not use any products that contain nicotine or tobacco, such as cigarettes, e-cigarettes, and chewing tobacco. If you need help quitting, ask your health care provider.  Do not use street drugs.  Do not share needles.  Ask your health care provider for help if you need support or information about quitting drugs. Alcohol use  Do not drink alcohol if your health care provider tells you not to drink.  If you drink alcohol: ? Limit how much you have to 0-2 drinks a day. ? Be aware of how much alcohol is in your drink. In the [place] one drink equals one 12 oz bottle of beer (355 mL), one 5 oz glass of wine (148 mL), or one 1 oz glass of hard liquor (44 mL). General instructions  Schedule regular health, dental, and eye exams.  Stay current with your vaccines.  Tell your health care provider if: ? You often feel depressed. ? You have ever been abused or do not feel safe at home. Summary  Adopting a healthy lifestyle and getting preventive care are important in promoting health and wellness.  Follow your health care provider's instructions about healthy diet, exercising, and getting tested or screened for diseases.  Follow your health care provider's instructions on monitoring your cholesterol and blood pressure. This  information is not intended to replace advice given to you by your health care provider. Make sure you discuss any questions you have with your health care provider. Document Revised: 05/19/2018 Document Reviewed: 05/19/2018 Elsevier Patient Education  2020 [facility]) Exercise Recommendation  Being physically active is important to prevent heart disease and stroke, the nation's No. 1and No. 5killers. To improve overall cardiovascular health, we suggest at least 150 minutes per week of moderate exercise or 75 minutes per week of vigorous exercise (or a combination of moderate and vigorous activity). Thirty minutes a day, five times a week is an easy goal to remember. You will also experience benefits even if you divide your time into two or three segments of 10 to 15 minutes per day.  For people who would benefit from lowering their blood pressure or cholesterol, we recommend 40 minutes of aerobic exercise of moderate to vigorous intensity three to four times a week to lower the risk for heart attack and stroke.  Physical activity is anything that makes you move your body and burn calories.  This includes things like climbing stairs or playing sports. Aerobic exercises benefit your heart, and include walking, jogging, swimming or biking. Strength and stretching exercises are best for overall stamina and flexibility.  The simplest, positive change you can make to effectively improve your heart health is to start walking. It's enjoyable, free, easy, social and great exercise. A walking program is flexible and boasts high success rates because people can stick with it. It's easy for walking to become a regular and satisfying part of life.   For Overall Cardiovascular Health:  At least 30 minutes of moderate-intensity aerobic activity at least 5 days per week for a total of 150  OR   At least 25 minutes of vigorous aerobic activity at least 3 days per week for  a total of 75 minutes; or a combination of moderate- and vigorous-intensity aerobic activity  AND   Moderate- to high-intensity muscle-strengthening activity at least 2 days per week for additional health benefits.  For Lowering Blood Pressure and Cholesterol  An average 40 minutes of moderate- to vigorous-intensity aerobic activity 3 or 4 times per week  What if I can't make it to the time goal? Something is always better than nothing! And everyone has to start somewhere. Even if you've been sedentary for years, today is the day you can begin to make healthy changes in your life. If you don't think you'll make it for 30 or 40  minutes, set a reachable goal for today. You can work up toward your overall goal by increasing your time as you get stronger. Don't let all-or-nothing thinking rob you of doing what you can every day.  Source:http://www.heart.org

## 2019-08-12 NOTE — Progress Notes (Signed)
BP 128/72 (BP Location: Left Arm, Patient Position: Sitting)   Pulse 75   Temp 98.5 F (36.9 C) (Oral)   Ht 5' 11.7" (1.821 m)   Wt 297 lb 12.8 oz (135.1 kg)   SpO2 97%   BMI 40.73 kg/m    Subjective:    Patient ID: Joseph [name], male    DOB: 1960/10/14, 59 y.o.   MRN: [id number]  HPI: Joseph [name] is a 59 y.o. male presenting on 08/12/2019 for comprehensive medical examination. Current medical complaints include:none  He currently lives with: Interim Problems from his last visit: no   No major concerns today.  Right knee pain has improved since steroid injection on 07/01/2019.  He is able to walk daily and has lost some weight.  Would like referral to ortho for discussion of future needs.  Functional Status Survey: Is the patient deaf or have difficulty hearing?: No Does the patient have difficulty seeing, even when wearing glasses/contacts?: No Does the patient have difficulty concentrating, remembering, or making decisions?: No Does the patient have difficulty walking or climbing stairs?: No Does the patient have difficulty dressing or bathing?: No Does the patient have difficulty doing errands alone such as visiting a doctor's office or shopping?: No  FALL RISK: Fall Risk  08/12/2019 06/27/2019  Falls in the past year? 1 1  Number falls in past yr: 0 0  Injury with Fall? 0 0  Follow up Falls evaluation completed -    Depression Screen Depression screen [facility] 2/9 08/12/2019 06/27/2019 06/27/2019  Decreased Interest 0 0 0  Down, Depressed, Hopeless 0 0 0  PHQ - 2 Score 0 0 0  Altered sleeping - 0 1  Tired, decreased energy - 0 0  Change in appetite - 0 0  Feeling bad or failure about yourself  - 0 0  Trouble concentrating - 0 0  Moving slowly or fidgety/restless - 0 0  Suicidal thoughts - 0 0  PHQ-9 Score - 0 1  Difficult doing work/chores - - Not difficult at all    Advanced Directives <no information>  Past Medical History:  Past Medical History:  Diagnosis Date  .  Hernia, hiatal    20 years ago    Surgical History:  Past Surgical History:  Procedure Laterality Date  . HIATAL HERNIA REPAIR      Medications:  Current Outpatient Medications on File Prior to Visit  Medication Sig  . acetaminophen (TYLENOL) 500 MG tablet Take 500 mg by mouth every 6 (six) hours as needed.  Marland Kitchen ibuprofen (ADVIL) 600 MG tablet Take 600 mg by mouth every 6 (six) hours as needed.  . nystatin cream (MYCOSTATIN) Apply 1 application topically 2 (two) times daily. To affected areas.   No current facility-administered medications on file prior to visit.    Allergies:  No Known Allergies  Social History:  Social History   Socioeconomic History  . Marital status: Married    Spouse name: Not on file  . Number of children: Not on file  . Years of education: Not on file  . Highest education level: Not on file  Occupational History    Comment: [employer]  Tobacco Use  . Smoking status: Never Smoker  . Smokeless tobacco: Current User    Types: Chew  Substance and Sexual Activity  . Alcohol use: Yes  . Drug use: Never  . Sexual activity: Yes  Other Topics Concern  . Not on file  Social History Narrative  . Not on file  Social Determinants of Health   Financial Resource Strain: Unknown  . Difficulty of Paying Living Expenses: Patient refused  Food Insecurity: No Food Insecurity  . Worried About Charity fundraiser in the Last Year: Never true  . Ran Out of Food in the Last Year: Never true  Transportation Needs: No Transportation Needs  . Lack of Transportation (Medical): No  . Lack of Transportation (Non-Medical): No  Physical Activity: Insufficiently Active  . Days of Exercise per Week: 3 days  . Minutes of Exercise per Session: 30 min  Stress: No Stress Concern Present  . Feeling of Stress : Only a little  Social Connections: Slightly Isolated  . Frequency of Communication with Friends and Family: Three times a week  . Frequency of Social  Gatherings with Friends and Family: Three times a week  . Attends Religious Services: More than 4 times per year  . Active Member of Clubs or Organizations: No  . Attends Archivist Meetings: Never  . Marital Status: Married  Human resources officer Violence:   . Fear of Current or Ex-Partner: Not on file  . Emotionally Abused: Not on file  . Physically Abused: Not on file  . Sexually Abused: Not on file   Social History   Tobacco Use  Smoking Status Never Smoker  Smokeless Tobacco Current User  . Types: Chew   Social History   Substance and Sexual Activity  Alcohol Use Yes    Family History:  Family History  Problem Relation Age of Onset  . Leukemia Father   . COPD Sister     Past medical history, surgical history, medications, allergies, family history and social history reviewed with patient today and changes made to appropriate areas of the chart.   Review of Systems - negative All other ROS negative except what is listed above and in the HPI.      Objective:    BP 128/72 (BP Location: Left Arm, Patient Position: Sitting)   Pulse 75   Temp 98.5 F (36.9 C) (Oral)   Ht 5' 11.7" (1.821 m)   Wt 297 lb 12.8 oz (135.1 kg)   SpO2 97%   BMI 40.73 kg/m   Wt Readings from Last 3 Encounters:  08/12/19 297 lb 12.8 oz (135.1 kg)  06/27/19 (!) 305 lb 9.6 oz (138.6 kg)    Physical Exam Vitals and nursing note reviewed.  Constitutional:      General: He is awake. He is not in acute distress.    Appearance: He is well-developed. He is morbidly obese. He is not ill-appearing.  HENT:     Head: Normocephalic and atraumatic.     Right Ear: Hearing, tympanic membrane, ear canal and external ear normal. No drainage.     Left Ear: Hearing, tympanic membrane, ear canal and external ear normal. No drainage.     Nose: Nose normal.     Mouth/Throat:     Pharynx: Uvula midline.  Eyes:     General: Lids are normal.        Right eye: No discharge.        Left eye: No  discharge.     Extraocular Movements: Extraocular movements intact.     Conjunctiva/sclera: Conjunctivae normal.     Pupils: Pupils are equal, round, and reactive to light.     Visual Fields: Right eye visual fields normal and left eye visual fields normal.  Neck:     Thyroid: No thyromegaly.     Vascular: No carotid bruit  or JVD.     Trachea: Trachea normal.  Cardiovascular:     Rate and Rhythm: Normal rate and regular rhythm.     Heart sounds: Normal heart sounds, S1 normal and S2 normal. No murmur. No gallop.   Pulmonary:     Effort: Pulmonary effort is normal. No accessory muscle usage or respiratory distress.     Breath sounds: Normal breath sounds.  Abdominal:     General: Bowel sounds are normal.     Palpations: Abdomen is soft. There is no hepatomegaly or splenomegaly.     Tenderness: There is no abdominal tenderness.  Musculoskeletal:        General: Normal range of motion.     Cervical back: Normal range of motion and neck supple.     Right lower leg: No edema.     Left lower leg: No edema.  Lymphadenopathy:     Head:     Right side of head: No submental, submandibular, tonsillar, preauricular or posterior auricular adenopathy.     Left side of head: No submental, submandibular, tonsillar, preauricular or posterior auricular adenopathy.     Cervical: No cervical adenopathy.  Skin:    General: Skin is warm and dry.     Capillary Refill: Capillary refill takes less than 2 seconds.     Findings: No rash.  Neurological:     Mental Status: He is alert and oriented to person, place, and time.     Cranial Nerves: Cranial nerves are intact.     Gait: Gait is intact.     Deep Tendon Reflexes: Reflexes are normal and symmetric.     Reflex Scores:      Brachioradialis reflexes are 2+ on the right side and 2+ on the left side.      Patellar reflexes are 2+ on the right side and 2+ on the left side. Psychiatric:        Attention and Perception: Attention normal.        Mood  and Affect: Mood normal.        Speech: Speech normal.        Behavior: Behavior normal. Behavior is cooperative.        Thought Content: Thought content normal.        Cognition and Memory: Cognition normal.        Judgment: Judgment normal.     Results for orders placed or performed in visit on 06/27/19  Comprehensive metabolic panel  Result Value Ref Range   Glucose 101 (H) 65 - 99 mg/dL   BUN 11 6 - 24 mg/dL   Creatinine, Ser 0.96 0.76 - 1.27 mg/dL   GFR calc non Af Amer 87 >59 mL/min/1.73   GFR calc Af Amer 100 >59 mL/min/1.73   BUN/Creatinine Ratio 11 9 - 20   Sodium 142 134 - 144 mmol/L   Potassium 3.9 3.5 - 5.2 mmol/L   Chloride 104 96 - 106 mmol/L   CO2 24 20 - 29 mmol/L   Calcium 9.0 8.7 - 10.2 mg/dL   Total Protein 6.7 6.0 - 8.5 g/dL   Albumin 4.4 3.8 - 4.9 g/dL   Globulin, Total 2.3 1.5 - 4.5 g/dL   Albumin/Globulin Ratio 1.9 1.2 - 2.2   Bilirubin Total 0.5 0.0 - 1.2 mg/dL   Alkaline Phosphatase 77 39 - 117 IU/L   AST 48 (H) 0 - 40 IU/L   [name] 43 0 - 44 IU/L  TSH  Result Value Ref Range   TSH 3.850  0.450 - 4.500 uIU/mL  HgB A1c  Result Value Ref Range   Hgb A1c MFr Bld 4.8 4.8 - 5.6 %   Est. average glucose Bld gHb Est-mCnc 91 mg/dL      Assessment & Plan:   Problem List Items Addressed This Visit      Other   Morbid obesity ([place])    Praised for 8 pounds of weight loss.  Will check labs today.  Recommend continued focus on healthy diet choices and regular physical activity (30 minutes 5 days a week).       Relevant Orders   Comprehensive metabolic panel   Lipid panel   TSH   Elevated BP without diagnosis of hypertension    Initial BP elevated and repeat at goal.  Recommend focus on DASH diet and modest weight loss at home.  Check BP at home at least 3 mornings a week and document.  Will obtain CMP and TSH today.  Suspect more white coat hypertension, but will monitor and initiate medication as needed.      Relevant Orders   Comprehensive metabolic  panel   TSH   Chronic pain of both knees    Ortho referral placed per patient request.  Right knee function and pain has improved post steroid injection, but would like to discuss future knees since bone on bone noted on imaging.      Relevant Orders   Ambulatory referral to Orthopedics    Other Visit Diagnoses    Need for Tdap vaccination    -  Primary   Relevant Orders   Tdap vaccine greater than or equal to 7yo IM (Completed)   Need for influenza vaccination       Relevant Orders   Flu Vaccine QUAD 36+ mos IM (Completed)   Screening for colon cancer       Relevant Orders   Ambulatory referral to Gastroenterology   Annual physical exam       Relevant Orders   CBC with Differential   Lipid panel   TSH   PSA       Discussed aspirin prophylaxis for myocardial infarction prevention and decision was it was not indicated  LABORATORY TESTING:  Health maintenance labs ordered today as discussed above.   The natural history of prostate cancer and ongoing controversy regarding screening and potential treatment outcomes of prostate cancer has been discussed with the patient. The meaning of a false positive PSA and a false negative PSA has been discussed. He indicates understanding of the limitations of this screening test and wishes to proceed with screening PSA testing.   IMMUNIZATIONS:   - Tdap: Tetanus vaccination status reviewed: last tetanus booster within 10 years. - Influenza: Up to date - Pneumovax: Not applicable - Prevnar: Not applicable - Zostavax vaccine: going to talk to insurance  SCREENING: - Colonoscopy: Up to date  Discussed with patient purpose of the colonoscopy is to detect colon cancer at curable precancerous or early stages   - AAA Screening: Not applicable  -Hearing Test: Not applicable  -Spirometry: Not applicable   PATIENT COUNSELING:    Sexuality: Discussed sexually transmitted diseases, partner selection, use of condoms, avoidance of unintended  pregnancy  and contraceptive alternatives.   Advised to avoid cigarette smoking.  I discussed with the patient that most people either abstain from alcohol or drink within safe limits (<=14/week and <=4 drinks/occasion for males, <=7/weeks and <= 3 drinks/occasion for females) and that the risk for alcohol disorders and other health effects rises  proportionally with the number of drinks per week and how often a drinker exceeds daily limits.  Discussed cessation/primary prevention of drug use and availability of treatment for abuse.   Diet: Encouraged to adjust caloric intake to maintain  or achieve ideal body weight, to reduce intake of dietary saturated fat and total fat, to limit sodium intake by avoiding high sodium foods and not adding table salt, and to maintain adequate dietary potassium and calcium preferably from fresh fruits, vegetables, and low-fat dairy products.    stressed the importance of regular exercise  Injury prevention: Discussed safety belts, safety helmets, smoke detector, smoking near bedding or upholstery.   Dental health: Discussed importance of regular tooth brushing, flossing, and dental visits.   Follow up plan: NEXT PREVENTATIVE PHYSICAL DUE IN 1 YEAR. Return in about 1 year (around 08/11/2020) for Annual physical.

## 2019-08-12 NOTE — Assessment & Plan Note (Signed)
Praised for 8 pounds of weight loss.  Will check labs today.  Recommend continued focus on healthy diet choices and regular physical activity (30 minutes 5 days a week).

## 2019-08-12 NOTE — Assessment & Plan Note (Signed)
Initial BP elevated and repeat at goal.  Recommend focus on DASH diet and modest weight loss at home.  Check BP at home at least 3 mornings a week and document.  Will obtain CMP and TSH today.  Suspect more white coat hypertension, but will monitor and initiate medication as needed.

## 2019-08-13 LAB — CBC WITH DIFFERENTIAL/PLATELET
Basophils Absolute: 0.1 10*3/uL (ref 0.0–0.2)
Basos: 1 %
EOS (ABSOLUTE): 0.2 10*3/uL (ref 0.0–0.4)
Eos: 5 %
Hematocrit: 44.8 % (ref 37.5–51.0)
Hemoglobin: 15.6 g/dL (ref 13.0–17.7)
Immature Grans (Abs): 0 10*3/uL (ref 0.0–0.1)
Immature Granulocytes: 0 %
Lymphocytes Absolute: 1.3 10*3/uL (ref 0.7–3.1)
Lymphs: 28 %
MCH: 35.7 pg — ABNORMAL HIGH (ref 26.6–33.0)
MCHC: 34.8 g/dL (ref 31.5–35.7)
MCV: 103 fL — ABNORMAL HIGH (ref 79–97)
Monocytes Absolute: 0.4 10*3/uL (ref 0.1–0.9)
Monocytes: 8 %
Neutrophils Absolute: 2.6 10*3/uL (ref 1.4–7.0)
Neutrophils: 58 %
Platelets: 112 10*3/uL — ABNORMAL LOW (ref 150–450)
RBC: 4.37 x10E6/uL (ref 4.14–5.80)
RDW: 12 % (ref 11.6–15.4)
WBC: 4.5 10*3/uL (ref 3.4–10.8)

## 2019-08-13 LAB — COMPREHENSIVE METABOLIC PANEL
ALT: 82 IU/L — ABNORMAL HIGH (ref 0–44)
AST: 111 IU/L — ABNORMAL HIGH (ref 0–40)
Albumin/Globulin Ratio: 1.8 (ref 1.2–2.2)
Albumin: 4.4 g/dL (ref 3.8–4.9)
Alkaline Phosphatase: 74 IU/L (ref 39–117)
BUN/Creatinine Ratio: 11 (ref 9–20)
BUN: 12 mg/dL (ref 6–24)
Bilirubin Total: 0.6 mg/dL (ref 0.0–1.2)
CO2: 22 mmol/L (ref 20–29)
Calcium: 8.9 mg/dL (ref 8.7–10.2)
Chloride: 103 mmol/L (ref 96–106)
Creatinine, Ser: 1.05 mg/dL (ref 0.76–1.27)
GFR calc Af Amer: 89 mL/min/{1.73_m2} (ref >59)
GFR calc non Af Amer: 77 mL/min/{1.73_m2} (ref >59)
Globulin, Total: 2.4 g/dL (ref 1.5–4.5)
Glucose: 77 mg/dL (ref 65–99)
Potassium: 4 mmol/L (ref 3.5–5.2)
Sodium: 143 mmol/L (ref 134–144)
Total Protein: 6.8 g/dL (ref 6.0–8.5)

## 2019-08-13 LAB — TSH: TSH: 2.44 u[IU]/mL (ref 0.450–4.500)

## 2019-08-13 LAB — LIPID PANEL
Chol/HDL Ratio: 3.3 ratio (ref 0.0–5.0)
Cholesterol, Total: 175 mg/dL (ref 100–199)
HDL: 53 mg/dL (ref >39)
LDL Chol Calc (NIH): 82 mg/dL (ref 0–99)
Triglycerides: 240 mg/dL — ABNORMAL HIGH (ref 0–149)
VLDL Cholesterol Cal: 40 mg/dL (ref 5–40)

## 2019-08-13 LAB — PSA: Prostate Specific Ag, Serum: 0.4 ng/mL (ref 0.0–4.0)

## 2019-08-14 ENCOUNTER — Other Ambulatory Visit: Payer: Self-pay | Admitting: Nurse Practitioner

## 2019-08-14 DIAGNOSIS — D696 Thrombocytopenia, unspecified: Secondary | ICD-10-CM

## 2019-08-14 DIAGNOSIS — R7401 Elevation of levels of liver transaminase levels: Secondary | ICD-10-CM

## 2019-08-14 NOTE — Progress Notes (Signed)
Contacted via MyChart

## 2019-09-01 DIAGNOSIS — Z23 Encounter for immunization: Secondary | ICD-10-CM | POA: Diagnosis not present

## 2019-09-13 ENCOUNTER — Encounter: Payer: Self-pay | Admitting: Nurse Practitioner

## 2019-09-14 ENCOUNTER — Other Ambulatory Visit: Payer: Self-pay | Admitting: Nurse Practitioner

## 2019-09-14 MED ORDER — NYSTATIN 100000 UNIT/GM EX CREA: 1.0000 "application " | TOPICAL_CREAM | Freq: Two times a day (BID) | CUTANEOUS | 1 refills | Status: DC

## 2019-09-14 MED ORDER — FLUCONAZOLE 150 MG PO TABS: 150.0000 mg | ORAL_TABLET | Freq: Once | ORAL | 0 refills | Status: AC

## 2019-09-22 DIAGNOSIS — Z23 Encounter for immunization: Secondary | ICD-10-CM | POA: Diagnosis not present

## 2019-10-13 ENCOUNTER — Encounter: Payer: Self-pay | Admitting: Nurse Practitioner

## 2019-11-08 ENCOUNTER — Other Ambulatory Visit: Payer: Self-pay | Admitting: Nurse Practitioner

## 2019-11-08 MED ORDER — NYSTATIN 100000 UNIT/GM EX CREA: 1.0000 "application " | TOPICAL_CREAM | Freq: Two times a day (BID) | CUTANEOUS | 1 refills | Status: DC

## 2019-11-10 ENCOUNTER — Telehealth (INDEPENDENT_AMBULATORY_CARE_PROVIDER_SITE_OTHER): Payer: Self-pay | Admitting: Gastroenterology

## 2019-11-10 ENCOUNTER — Other Ambulatory Visit (INDEPENDENT_AMBULATORY_CARE_PROVIDER_SITE_OTHER): Payer: Self-pay

## 2019-11-10 DIAGNOSIS — Z1211 Encounter for screening for malignant neoplasm of colon: Secondary | ICD-10-CM

## 2019-11-10 NOTE — Progress Notes (Signed)
Gastroenterology Pre-Procedure Review  Request Date: Friday 12/02/19 Requesting Physician: Dr. Vicente Males  PATIENT REVIEW QUESTIONS: The patient responded to the following health history questions as indicated:    1. Are you having any GI issues? no 2. Do you have a personal history of Polyps? no 3. Do you have a family history of Colon Cancer or Polyps? no 4. Diabetes Mellitus? no 5. Joint replacements in the past 12 months?no 6. Major health problems in the past 3 months?no 7. Any artificial heart valves, MVP, or defibrillator?no    MEDICATIONS & ALLERGIES:    Patient reports the following regarding taking any anticoagulation/antiplatelet therapy:   Plavix, Coumadin, Eliquis, Xarelto, Lovenox, Pradaxa, Brilinta, or Effient? no Aspirin? no  Patient confirms/reports the following medications:  Current Outpatient Medications  Medication Sig Dispense Refill  . acetaminophen (TYLENOL) 500 MG tablet Take 500 mg by mouth every 6 (six) hours as needed.    Marland Kitchen ibuprofen (ADVIL) 600 MG tablet Take 600 mg by mouth every 6 (six) hours as needed.    . nystatin cream (MYCOSTATIN) Apply 1 application topically 2 (two) times daily. To affected areas. (Patient not taking: Reported on 11/10/2019) 30 g 1   No current facility-administered medications for this visit.    Patient confirms/reports the following allergies:  No Known Allergies  No orders of the defined types were placed in this encounter.   AUTHORIZATION INFORMATION Primary Insurance: 1D#: Group #:  Secondary Insurance: 1D#: Group #:  SCHEDULE INFORMATION: Date: 12/02/19 Time: Location:[facility]

## 2019-11-11 DIAGNOSIS — M170 Bilateral primary osteoarthritis of knee: Secondary | ICD-10-CM | POA: Diagnosis not present

## 2019-11-30 ENCOUNTER — Other Ambulatory Visit
Admission: RE | Admit: 2019-11-30 | Discharge: 2019-11-30 | Disposition: A | Discharge status: Home / Self Care | Payer: BC Managed Care – PPO | Source: Ambulatory Visit | Attending: Gastroenterology | Admitting: Gastroenterology

## 2019-11-30 ENCOUNTER — Other Ambulatory Visit: Payer: Self-pay

## 2019-11-30 DIAGNOSIS — Z01812 Encounter for preprocedural laboratory examination: Secondary | ICD-10-CM | POA: Insufficient documentation

## 2019-11-30 DIAGNOSIS — Z20822 Contact with and (suspected) exposure to covid-19: Secondary | ICD-10-CM | POA: Diagnosis not present

## 2019-11-30 LAB — SARS CORONAVIRUS 2 (TAT 6-24 HRS): SARS Coronavirus 2: NEGATIVE

## 2019-12-02 ENCOUNTER — Ambulatory Visit: Payer: BC Managed Care – PPO | Admitting: Anesthesiology

## 2019-12-02 ENCOUNTER — Encounter: Payer: Self-pay | Admitting: Gastroenterology

## 2019-12-02 ENCOUNTER — Encounter
Admission: RE | Disposition: A | Discharge status: Home / Self Care | Payer: Self-pay | Source: Home / Self Care | Attending: Gastroenterology

## 2019-12-02 ENCOUNTER — Ambulatory Visit
Admission: RE | Admit: 2019-12-02 | Discharge: 2019-12-02 | Disposition: A | Discharge status: Home / Self Care | Payer: BC Managed Care – PPO | Attending: Gastroenterology | Admitting: Gastroenterology

## 2019-12-02 DIAGNOSIS — Z6837 Body mass index (BMI) 37.0-37.9, adult: Secondary | ICD-10-CM | POA: Insufficient documentation

## 2019-12-02 DIAGNOSIS — D126 Benign neoplasm of colon, unspecified: Secondary | ICD-10-CM | POA: Diagnosis not present

## 2019-12-02 DIAGNOSIS — Q438 Other specified congenital malformations of intestine: Secondary | ICD-10-CM | POA: Insufficient documentation

## 2019-12-02 DIAGNOSIS — E669 Obesity, unspecified: Secondary | ICD-10-CM | POA: Insufficient documentation

## 2019-12-02 DIAGNOSIS — D124 Benign neoplasm of descending colon: Secondary | ICD-10-CM | POA: Insufficient documentation

## 2019-12-02 DIAGNOSIS — D120 Benign neoplasm of cecum: Secondary | ICD-10-CM | POA: Diagnosis not present

## 2019-12-02 DIAGNOSIS — Z1211 Encounter for screening for malignant neoplasm of colon: Secondary | ICD-10-CM | POA: Diagnosis not present

## 2019-12-02 HISTORY — PX: COLONOSCOPY WITH PROPOFOL: SHX5780

## 2019-12-02 SURGERY — COLONOSCOPY WITH PROPOFOL
Anesthesia: General

## 2019-12-02 MED ORDER — EPHEDRINE SULFATE 50 MG/ML IJ SOLN
INTRAMUSCULAR | Status: DC | PRN
  Administered 2019-12-02: 10 mg via INTRAVENOUS

## 2019-12-02 MED ORDER — SODIUM CHLORIDE 0.9 % IV SOLN
INTRAVENOUS | Status: DC
  Administered 2019-12-02: 20 mL/h via INTRAVENOUS

## 2019-12-02 MED ORDER — LIDOCAINE HCL (CARDIAC) PF 100 MG/5ML IV SOSY
PREFILLED_SYRINGE | INTRAVENOUS | Status: DC | PRN
  Administered 2019-12-02: 20 mg via INTRAVENOUS

## 2019-12-02 MED ORDER — PROPOFOL 500 MG/50ML IV EMUL
INTRAVENOUS | Status: AC
  Filled 2019-12-02: qty 50

## 2019-12-02 MED ORDER — PROPOFOL 10 MG/ML IV BOLUS
INTRAVENOUS | Status: AC
  Filled 2019-12-02: qty 20

## 2019-12-02 MED ORDER — PROPOFOL 500 MG/50ML IV EMUL
INTRAVENOUS | Status: DC | PRN
  Administered 2019-12-02: 100 mg via INTRAVENOUS
  Administered 2019-12-02: 150 ug/kg/min via INTRAVENOUS

## 2019-12-02 NOTE — Op Note (Signed)
[facility] Gastroenterology Patient Name: Joseph [name] Procedure Date: 12/02/2019 8:39 AM MRN: [id number] Account #: [id number] Date of Birth: February 05, 1961 Admit Type: Outpatient Age: 59 Room: [facility] ENDO ROOM 2 Gender: Male Note Status: Finalized Procedure:             Colonoscopy Indications:           Screening for colorectal malignant neoplasm Providers:             Jonathon Bellows MD, MD Referring MD:          No Local Md, MD (Referring MD) Medicines:             Monitored Anesthesia Care Complications:         No immediate complications. Procedure:             Pre-Anesthesia Assessment:                        - Prior to the procedure, a History and Physical was                         performed, and patient medications, allergies and                         sensitivities were reviewed. The patient's tolerance                         of previous anesthesia was reviewed.                        - The risks and benefits of the procedure and the                         sedation options and risks were discussed with the                         patient. All questions were answered and informed                         consent was obtained.                        - ASA Grade Assessment: III - A patient with severe                         systemic disease.                        After obtaining informed consent, the colonoscope was                         passed under direct vision. Throughout the procedure,                         the patient's blood pressure, pulse, and oxygen                         saturations were monitored continuously. The                         Colonoscope was introduced through the anus  and                         advanced to the the cecum, identified by the                         appendiceal orifice. The colonoscopy was extremely                         difficult due to significant looping and the patient's                         body habitus.  Successful completion of the procedure                         was aided by applying abdominal pressure. The patient                         tolerated the procedure well. The quality of the bowel                         preparation was adequate. Findings:      The perianal and digital rectal examinations were normal.      A 5 mm polyp was found in the cecum. The polyp was sessile. The polyp       was removed with a jumbo cold forceps. Resection and retrieval were       complete.      A 5 mm polyp was found in the descending colon. The polyp was sessile.       The polyp was removed with a cold snare. Resection and retrieval were       complete.      The exam was otherwise without abnormality on direct and retroflexion       views. Impression:            - One 5 mm polyp in the cecum, removed with a jumbo                         cold forceps. Resected and retrieved.                        - One 5 mm polyp in the descending colon, removed with                         a cold snare. Resected and retrieved.                        - The examination was otherwise normal on direct and                         retroflexion views. Recommendation:        - Discharge patient to home (with escort).                        - Resume previous diet.                        - Continue present medications.                        -  Await pathology results.                        - Repeat colonoscopy for surveillance. Procedure Code(s):     --- Professional ---                        [phone], Colonoscopy, flexible; with removal of                         tumor(s), polyp(s), or other lesion(s) by snare                         technique                        45380, 51, Colonoscopy, flexible; with biopsy, single                         or multiple Diagnosis Code(s):     --- Professional ---                        Z12.11, Encounter for screening for malignant neoplasm                         of colon                         K63.5, Polyp of colon CPT copyright 2019 American Medical Association. All rights reserved. The codes documented in this report are preliminary and upon coder review may  be revised to meet current compliance requirements. Jonathon Bellows, MD Jonathon Bellows MD, MD 12/02/2019 9:34:59 AM This report has been signed electronically. Number of Addenda: 0 Note Initiated On: 12/02/2019 8:39 AM Scope Withdrawal Time: 0 hours 13 minutes 18 seconds  Total Procedure Duration: 0 hours 48 minutes 23 seconds  Estimated Blood Loss:  Estimated blood loss: none.      [facility]

## 2019-12-02 NOTE — Anesthesia Postprocedure Evaluation (Signed)
Anesthesia Post Note  Patient: Joseph [name]  Procedure(s) Performed: COLONOSCOPY WITH PROPOFOL (N/A )  Patient location during evaluation: Endoscopy Anesthesia Type: General Level of consciousness: awake and alert Pain management: pain level controlled Vital Signs Assessment: post-procedure vital signs reviewed and stable Respiratory status: spontaneous breathing, nonlabored ventilation, respiratory function stable and patient connected to nasal cannula oxygen Cardiovascular status: blood pressure returned to baseline and stable Postop Assessment: no apparent nausea or vomiting Anesthetic complications: no   No complications documented.   Last Vitals:  Vitals:   12/02/19 0935 12/02/19 0945  BP: 99/68 105/73  Pulse: 63 66  Resp: 14 (!) 23  Temp: 36.8 C   SpO2: 94% 95%    Last Pain:  Vitals:   12/02/19 0955  TempSrc:   PainSc: (P) 0-No pain                 Martha Clan

## 2019-12-02 NOTE — Transfer of Care (Signed)
Immediate Anesthesia Transfer of Care Note  Patient: Joseph [name]  Procedure(s) Performed: COLONOSCOPY WITH PROPOFOL (N/A )  Patient Location: PACU  Anesthesia Type:General  Level of Consciousness: awake, alert  and oriented  Airway & Oxygen Therapy: Patient connected to nasal cannula oxygen  Post-op Assessment: Report given to RN  Post vital signs: stable  Last Vitals:  Vitals Value Taken Time  BP 99/68 12/02/19 0935  Temp 36.8 C 12/02/19 0935  Pulse 63 12/02/19 0940  Resp 19 12/02/19 0940  SpO2 94 % 12/02/19 0940  Vitals shown include unvalidated device data.  Last Pain:  Vitals:   12/02/19 0935  TempSrc: Temporal  PainSc:          Complications: No complications documented.

## 2019-12-02 NOTE — Anesthesia Preprocedure Evaluation (Signed)
Anesthesia Evaluation  Patient identified by MRN, date of birth, ID band Patient awake    Reviewed: Allergy & Precautions, H&P , NPO status , Patient's Chart, lab work & pertinent test results, reviewed documented beta blocker date and time   History of Anesthesia Complications Negative for: history of anesthetic complications  Airway Mallampati: I  TM Distance: >3 FB Neck ROM: full    Dental  (+) Dental Advidsory Given, Missing, Chipped, Loose   Pulmonary neg pulmonary ROS,    Pulmonary exam normal breath sounds clear to auscultation       Cardiovascular Exercise Tolerance: Good negative cardio ROS Normal cardiovascular exam Rhythm:regular Rate:Normal     Neuro/Psych negative neurological ROS  negative psych ROS   GI/Hepatic Neg liver ROS, hiatal hernia,   Endo/Other  negative endocrine ROS  Renal/GU negative Renal ROS  negative genitourinary   Musculoskeletal   Abdominal   Peds  Hematology negative hematology ROS (+)   Anesthesia Other Findings Past Medical History: No date: Hernia, hiatal     Comment:  20 years ago Obesity  Reproductive/Obstetrics negative OB ROS                             Anesthesia Physical Anesthesia Plan  ASA: II  Anesthesia Plan: General   Post-op Pain Management:    Induction: Intravenous  PONV Risk Score and Plan: 2 and Propofol infusion and TIVA  Airway Management Planned: Natural Airway and Nasal Cannula  Additional Equipment:   Intra-op Plan:   Post-operative Plan:   Informed Consent: I have reviewed the patients History and Physical, chart, labs and discussed the procedure including the risks, benefits and alternatives for the proposed anesthesia with the patient or authorized representative who has indicated his/her understanding and acceptance.     Dental Advisory Given  Plan Discussed with: Anesthesiologist, CRNA and  Surgeon  Anesthesia Plan Comments:         Anesthesia Quick Evaluation

## 2019-12-02 NOTE — H&P (Signed)
Jonathon Bellows, MD [address] [place], [place], [place], [address] [address], [place], [place], [place], [address] Phone: [phone]  Fax: [phone]  Primary Care Physician:  Venita Lick, NP   Pre-Procedure History & Physical: HPI:  Joseph [name] is a 59 y.o. male is here for an colonoscopy.   Past Medical History:  Diagnosis Date  . Hernia, hiatal    20 years ago    Past Surgical History:  Procedure Laterality Date  . HIATAL HERNIA REPAIR      Prior to Admission medications   Medication Sig Start Date End Date Taking? Authorizing Provider  acetaminophen (TYLENOL) 500 MG tablet Take 500 mg by mouth every 6 (six) hours as needed.   Yes [provider]  ibuprofen (ADVIL) 600 MG tablet Take 600 mg by mouth every 6 (six) hours as needed.   Yes [provider]  nystatin cream (MYCOSTATIN) Apply 1 application topically 2 (two) times daily. To affected areas. 11/08/19  Yes Marnee Guarneri T, NP    Allergies as of 11/10/2019  . (No Known Allergies)    Family History  Problem Relation Age of Onset  . Leukemia Father   . COPD Sister     Social History   Socioeconomic History  . Marital status: Married    Spouse name: Not on file  . Number of children: Not on file  . Years of education: Not on file  . Highest education level: Not on file  Occupational History    Comment: [employer]  Tobacco Use  . Smoking status: Never Smoker  . Smokeless tobacco: Current User    Types: Chew  Vaping Use  . Vaping Use: Never used  Substance and Sexual Activity  . Alcohol use: Yes  . Drug use: Never  . Sexual activity: Yes  Other Topics Concern  . Not on file  Social History Narrative  . Not on file   Social Determinants of Health   Financial Resource Strain: Unknown  . Difficulty of Paying Living Expenses: Patient refused  Food Insecurity: No Food Insecurity  . Worried About Charity fundraiser in the Last Year: Never true  . Ran  Out of Food in the Last Year: Never true  Transportation Needs: No Transportation Needs  . Lack of Transportation (Medical): No  . Lack of Transportation (Non-Medical): No  Physical Activity: Insufficiently Active  . Days of Exercise per Week: 3 days  . Minutes of Exercise per Session: 30 min  Stress: No Stress Concern Present  . Feeling of Stress : Only a little  Social Connections: Moderately Integrated  . Frequency of Communication with Friends and Family: Three times a week  . Frequency of Social Gatherings with Friends and Family: Three times a week  . Attends Religious Services: More than 4 times per year  . Active Member of Clubs or Organizations: No  . Attends Archivist Meetings: Never  . Marital Status: Married  Human resources officer Violence:   . Fear of Current or Ex-Partner:   . Emotionally Abused:   Marland Kitchen Physically Abused:   . Sexually Abused:     Review of Systems: See HPI, otherwise negative ROS  Physical Exam: BP (!) 147/105   Pulse 78   Temp (!) 96.7 F (35.9 C) (Temporal)   Resp 18   Ht 6' (1.829 m)   Wt 125.6 kg   SpO2 97%   BMI 37.57 kg/m  General:   Alert,  pleasant and  cooperative in NAD Head:  Normocephalic and atraumatic. Neck:  Supple; no masses or thyromegaly. Lungs:  Clear throughout to auscultation, normal respiratory effort.    Heart:  +S1, +S2, Regular rate and rhythm, No edema. Abdomen:  Soft, nontender and nondistended. Normal bowel sounds, without guarding, and without rebound.   Neurologic:  Alert and  oriented x4;  grossly normal neurologically.  Impression/Plan: [name] is here for an colonoscopy to be performed for Screening colonoscopy average risk   Risks, benefits, limitations, and alternatives regarding  colonoscopy have been reviewed with the patient.  Questions have been answered.  All parties agreeable.   Jonathon Bellows, MD  12/02/2019, 8:32 AM

## 2019-12-05 ENCOUNTER — Encounter: Payer: Self-pay | Admitting: Gastroenterology

## 2019-12-05 LAB — SURGICAL PATHOLOGY

## 2019-12-06 ENCOUNTER — Encounter: Payer: Self-pay | Admitting: Gastroenterology

## 2020-06-29 ENCOUNTER — Other Ambulatory Visit: Payer: Self-pay | Admitting: Nurse Practitioner

## 2020-06-29 DIAGNOSIS — R000 Tachycardia, unspecified: Secondary | ICD-10-CM

## 2020-07-02 ENCOUNTER — Encounter: Payer: Self-pay | Admitting: Nurse Practitioner

## 2020-07-02 ENCOUNTER — Other Ambulatory Visit: Payer: Self-pay

## 2020-07-02 ENCOUNTER — Ambulatory Visit: Payer: BC Managed Care – PPO | Admitting: Nurse Practitioner

## 2020-07-02 VITALS — BP 131/87 | HR 98 | Temp 98.5°F | Ht 72.24 in | Wt 248.8 lb

## 2020-07-02 DIAGNOSIS — Z23 Encounter for immunization: Secondary | ICD-10-CM

## 2020-07-02 DIAGNOSIS — Z6833 Body mass index (BMI) 33.0-33.9, adult: Secondary | ICD-10-CM | POA: Diagnosis not present

## 2020-07-02 DIAGNOSIS — R000 Tachycardia, unspecified: Secondary | ICD-10-CM | POA: Diagnosis not present

## 2020-07-02 DIAGNOSIS — E6609 Other obesity due to excess calories: Secondary | ICD-10-CM | POA: Diagnosis not present

## 2020-07-02 DIAGNOSIS — Z8679 Personal history of other diseases of the circulatory system: Secondary | ICD-10-CM | POA: Insufficient documentation

## 2020-07-02 DIAGNOSIS — I4891 Unspecified atrial fibrillation: Secondary | ICD-10-CM | POA: Insufficient documentation

## 2020-07-02 MED ORDER — APIXABAN 5 MG PO TABS: 5.0000 mg | ORAL_TABLET | Freq: Two times a day (BID) | ORAL | 3 refills | Status: DC

## 2020-07-02 MED ORDER — APIXABAN 5 MG PO TABS
5.0000 mg | ORAL_TABLET | Freq: Two times a day (BID) | ORAL | 3 refills | Status: DC
Start: 2020-07-02 — End: 2020-07-02

## 2020-07-02 MED ORDER — APIXABAN 5 MG PO TABS
5.0000 mg | ORAL_TABLET | Freq: Two times a day (BID) | ORAL | 3 refills | Status: DC
Start: 2020-07-02 — End: 2020-10-28

## 2020-07-02 NOTE — Patient Instructions (Signed)
Sinus Tachycardia  Sinus tachycardia is a kind of fast heartbeat. In sinus tachycardia, the heart beats more than 100 times a minute. Sinus tachycardia starts in a part of the heart called the sinus node. Sinus tachycardia may be harmless, or it may be a sign of a serious condition. What are the causes? This condition may be caused by:  Exercise or exertion.  A fever.  Pain.  Loss of body fluids (dehydration).  Severe bleeding (hemorrhage).  Anxiety and stress.  Certain substances, including: ? Alcohol. ? Caffeine. ? Tobacco and nicotine products. ? Cold medicines. ? Illegal drugs.  Medical conditions including: ? Heart disease. ? An infection. ? An overactive thyroid (hyperthyroidism). ? A lack of red blood cells (anemia). What are the signs or symptoms? Symptoms of this condition include:  A feeling that the heart is beating quickly (palpitations).  Suddenly noticing your heartbeat (cardiac awareness).  Dizziness.  Tiredness (fatigue).  Shortness of breath.  Chest pain.  Nausea.  Fainting. How is this diagnosed? This condition is diagnosed with:  A physical exam.  Other tests, such as: ? Blood tests. ? An electrocardiogram (ECG). This test measures the electrical activity of the heart. ? Ambulatory cardiac monitor. This records your heartbeats for 24 hours or more. You may be referred to a heart specialist (cardiologist). How is this treated? Treatment for this condition depends on the cause or the underlying condition. Treatment may involve:  Treating the underlying condition.  Taking new medicines or changing your current medicines as told by your health care provider.  Making changes to your diet or lifestyle. Follow these instructions at home: Lifestyle  Do not use any products that contain nicotine or tobacco, such as cigarettes and e-cigarettes. If you need help quitting, ask your health care provider.  Do not use illegal drugs, such as  cocaine.  Learn relaxation methods to help you when you get stressed or anxious. These include deep breathing.  Avoid caffeine or other stimulants.   Alcohol use  Do not drink alcohol if: ? Your health care provider tells you not to drink. ? You are pregnant, may be pregnant, or are planning to become pregnant.  If you drink alcohol, limit how much you have: ? 0-1 drink a day for women. ? 0-2 drinks a day for men.  Be aware of how much alcohol is in your drink. In the [place] one drink equals one typical bottle of beer (12 oz), one-half glass of wine (5 oz), or one shot of hard liquor (1 oz).   General instructions  Drink enough fluids to keep your urine pale yellow.  Take over-the-counter and prescription medicines only as told by your health care provider.  Keep all follow-up visits as told by your health care provider. This is important. Contact a health care provider if you have:  A fever.  Vomiting or diarrhea that does not go away. Get help right away if you:  Have pain in your chest, upper arms, jaw, or neck.  Become weak or dizzy.  Feel faint.  Have palpitations that do not go away. Summary  In sinus tachycardia, the heart beats more than 100 times a minute.  Sinus tachycardia may be harmless, or it may be a sign of a serious condition.  Treatment for this condition depends on the cause or the underlying condition.  Get help right away if you have pain in your chest, upper arms, jaw, or neck. This information is not intended to replace advice given to   you by your health care provider. Make sure you discuss any questions you have with your health care provider. Document Revised: 07/15/2017 Document Reviewed: 07/15/2017 Elsevier Patient Education  2021 Elsevier Inc.  

## 2020-07-02 NOTE — Progress Notes (Addendum)
BP 131/87   Pulse 98 Comment: apical  Temp 98.5 F (36.9 C) (Oral)   Ht 6' 0.24" (1.835 m)   Wt 248 lb 12.8 oz (112.9 kg)   SpO2 97%   BMI 33.52 kg/m    Subjective:    Patient ID: Joseph [name], male    DOB: Oct 13, 1960, 60 y.o.   MRN: [id number]  HPI: Joseph [name] is a 60 y.o. male  Chief Complaint  Patient presents with  . Hight heart rate    Was doing pre op for a knee replacement, was postponed due to heart rate at 160.    TACHYCARDIA [facility] [place], was doing pre-op and HR was elevated.  Hospitalist placed him on Diltiazem 180 MG daily.  He is scheduled to see cardiology Wednesday.  Has not had any SOB, chest pain, palpitations, dizziness, N&V.  Brother has atrial fibrillation and recently found out he has a hole in his heart.  He did not take Diltiazem this morning. No alcohol use, stopped.  Non smoker.   Aspirin: no Recurrent headaches: caffeine withdrawals Visual changes: no Palpitations: no Dyspnea: no Chest pain: no Lower extremity edema: no Dizzy/lightheaded: no  Relevant past medical, surgical, family and social history reviewed and updated as indicated. Interim medical history since our last visit reviewed. Allergies and medications reviewed and updated.  Review of Systems  Constitutional: Negative for activity change, diaphoresis, fatigue and fever.  Respiratory: Negative for cough, chest tightness, shortness of breath and wheezing.   Cardiovascular: Negative for chest pain, palpitations and leg swelling.  Gastrointestinal: Negative.   Neurological: Negative.   Psychiatric/Behavioral: Negative.     Per HPI unless specifically indicated above     Objective:    BP 131/87   Pulse 98 Comment: apical  Temp 98.5 F (36.9 C) (Oral)   Ht 6' 0.24" (1.835 m)   Wt 248 lb 12.8 oz (112.9 kg)   SpO2 97%   BMI 33.52 kg/m   Wt Readings from Last 3 Encounters:  07/02/20 248 lb 12.8 oz (112.9 kg)  12/02/19 277 lb (125.6 kg)   08/12/19 297 lb 12.8 oz (135.1 kg)    Physical Exam Vitals and nursing note reviewed.  Constitutional:      General: He is awake. He is not in acute distress.    Appearance: He is well-developed and well-groomed. He is obese. He is not ill-appearing.  HENT:     Head: Normocephalic and atraumatic.     Right Ear: Hearing normal. No drainage.     Left Ear: Hearing normal. No drainage.  Eyes:     General: Lids are normal.        Right eye: No discharge.        Left eye: No discharge.     Conjunctiva/sclera: Conjunctivae normal.     Pupils: Pupils are equal, round, and reactive to light.  Neck:     Thyroid: No thyromegaly.     Vascular: No carotid bruit.     Trachea: Trachea normal.  Cardiovascular:     Rate and Rhythm: Tachycardia present. Rhythm irregular.     Heart sounds: Normal heart sounds, S1 normal and S2 normal. No murmur heard. No gallop.   Pulmonary:     Effort: Pulmonary effort is normal. No accessory muscle usage or respiratory distress.     Breath sounds: Normal breath sounds.  Abdominal:     General: Bowel sounds are normal.     Palpations: Abdomen is soft. There is  no hepatomegaly or splenomegaly.  Musculoskeletal:        General: Normal range of motion.     Cervical back: Normal range of motion and neck supple.     Right lower leg: No edema.     Left lower leg: No edema.  Skin:    General: Skin is warm and dry.     Capillary Refill: Capillary refill takes less than 2 seconds.     Findings: No rash.  Neurological:     Mental Status: He is alert and oriented to person, place, and time.     Deep Tendon Reflexes: Reflexes are normal and symmetric.  Psychiatric:        Attention and Perception: Attention normal.        Mood and Affect: Mood normal.        Speech: Speech normal.        Behavior: Behavior normal. Behavior is cooperative.        Thought Content: Thought content normal.    EKG in office with rate 130, tachy, slightly irregular, unable to  decipher p waves in pattern (?a-fib).    Results for orders placed or performed during the hospital encounter of 12/02/19  Surgical pathology  Result Value Ref Range   SURGICAL PATHOLOGY      SURGICAL PATHOLOGY CASE: [id number] PATIENT: [place] [facility]ology Report     Specimen Submitted: A. Colon polyp, cecum; cbx B. Colon polyp, descending; cold snare  Clinical History: Screening colonoscopy. Colon polyps.      DIAGNOSIS: A.  COLON POLYP, CECUM; COLD BIOPSY: - TUBULAR ADENOMA. - NEGATIVE FOR HIGH-GRADE DYSPLASIA AND MALIGNANCY.  B.  COLON POLYP, DESCENDING; COLD SNARE: - TUBULAR ADENOMA. - NEGATIVE FOR HIGH-GRADE DYSPLASIA AND MALIGNANCY.  GROSS DESCRIPTION: A. Labeled: Cecum polyp CBX Received: Formalin Tissue fragment(s): 1 Size: 0.7 cm Description: Tan soft tissue fragment Entirely submitted in 1 cassette.  B. Labeled: Descending colon polyp cold snare Received: Formalin Tissue fragment(s): Multiple Size: Aggregate, 1.5 x 0.6 x 0.2 cm Description: Tan soft tissue fragments with admixed fecal matter Entirely submitted in 1 cassette.   Final Diagnosis performed by Quay Burow, MD.   Electronically signed 12/05/2019  3:35:41PM The electronic signature indicates that the named Attending Pathologist has evaluated the specimen Technical component performed at [facility], [address], [place], [place] [address] Lab: [phone] Dir: Rush Farmer, MD, MMM  Professional component performed at [facility], [facility], [place], [place], [place] [address] Lab: [phone] Dir: Dellia Nims. Reuel Derby, MD       Assessment & Plan:   Problem List Items Addressed This Visit      Other   Obesity    BMI 33.52.  Recommended eating smaller high protein, low fat meals more frequently and exercising 30 mins a day 5 times a week with a goal of 10-15lb weight loss in the next 3 months. Patient voiced their understanding and motivation to  adhere to these recommendations.       Tachycardia - Primary    Noted a recent pre-op, asymptomatic with EKG ?a-fib, unable to decipher consistent/regular p waves and slightly irregular + irregularity noted on auscultation.  Has not take medication this morning.  Will continue Diltiazem at this time, sees cardiology in 2 days -- discussed with him this may be changed to BB for improved rate control.  Start Eliquis 5 MG BID at this time, CHADsVasc = 1.  He is aware cardiology may further adjust regimen dependent on exam findings.  Has significant family history with brother having a-fib and recently found "hole" in heart.  At this time continue to monitor closely and collaborate with cardiology.  Return in 6 weeks.      Relevant Orders   EKG 12-Lead (Completed)   TSH   Lipid Panel w/o Chol/HDL Ratio   Comprehensive metabolic panel       Follow up plan: Return in about 6 weeks (around 08/13/2020) for Heart follow-up.

## 2020-07-02 NOTE — Assessment & Plan Note (Signed)
BMI 33.52.  Recommended eating smaller high protein, low fat meals more frequently and exercising 30 mins a day 5 times a week with a goal of 10-15lb weight loss in the next 3 months. Patient voiced their understanding and motivation to adhere to these recommendations.

## 2020-07-02 NOTE — Assessment & Plan Note (Addendum)
Noted a recent pre-op, asymptomatic with EKG ?a-fib, unable to decipher consistent/regular p waves and slightly irregular + irregularity noted on auscultation.  Has not take medication this morning.  Will continue Diltiazem at this time, sees cardiology in 2 days -- discussed with him this may be changed to BB for improved rate control.  Start Eliquis 5 MG BID at this time, CHADsVasc = 1.  He is aware cardiology may further adjust regimen dependent on exam findings.  Has significant family history with brother having a-fib and recently found "hole" in heart.  At this time continue to monitor closely and collaborate with cardiology.  Return in 6 weeks.

## 2020-07-03 ENCOUNTER — Other Ambulatory Visit: Payer: Self-pay | Admitting: Nurse Practitioner

## 2020-07-03 LAB — LIPID PANEL W/O CHOL/HDL RATIO
Cholesterol, Total: 137 mg/dL (ref 100–199)
HDL: 47 mg/dL (ref >39)
LDL Chol Calc (NIH): 73 mg/dL (ref 0–99)
Triglycerides: 91 mg/dL (ref 0–149)
VLDL Cholesterol Cal: 17 mg/dL (ref 5–40)

## 2020-07-03 LAB — COMPREHENSIVE METABOLIC PANEL
ALT: 32 IU/L (ref 0–44)
AST: 34 IU/L (ref 0–40)
Albumin/Globulin Ratio: 1.8 (ref 1.2–2.2)
Albumin: 4.1 g/dL (ref 3.8–4.9)
Alkaline Phosphatase: 67 IU/L (ref 44–121)
BUN/Creatinine Ratio: 12 (ref 9–20)
BUN: 11 mg/dL (ref 6–24)
Bilirubin Total: 1.4 mg/dL — ABNORMAL HIGH (ref 0.0–1.2)
CO2: 24 mmol/L (ref 20–29)
Calcium: 9.2 mg/dL (ref 8.7–10.2)
Chloride: 99 mmol/L (ref 96–106)
Creatinine, Ser: 0.93 mg/dL (ref 0.76–1.27)
GFR calc Af Amer: 104 mL/min/{1.73_m2} (ref >59)
GFR calc non Af Amer: 90 mL/min/{1.73_m2} (ref >59)
Globulin, Total: 2.3 g/dL (ref 1.5–4.5)
Glucose: 90 mg/dL (ref 65–99)
Potassium: 3.9 mmol/L (ref 3.5–5.2)
Sodium: 142 mmol/L (ref 134–144)
Total Protein: 6.4 g/dL (ref 6.0–8.5)

## 2020-07-03 LAB — TSH: TSH: 3.38 u[IU]/mL (ref 0.450–4.500)

## 2020-07-03 MED ORDER — ROSUVASTATIN CALCIUM 10 MG PO TABS
10.0000 mg | ORAL_TABLET | Freq: Every day | ORAL | 3 refills | Status: DC
Start: 2020-07-03 — End: 2021-07-22

## 2020-07-03 NOTE — Progress Notes (Signed)
Contacted via MyChart The 10-year ASCVD risk score Mikey Bussing DC Jr., et al., 2013) is: 6%   Values used to calculate the score:     Age: 60 years     Sex: Male     Is Non-Hispanic African American: No     Diabetic: No     Tobacco smoker: No     Systolic Blood Pressure: 588 mmHg     Is BP treated: No     HDL Cholesterol: 47 mg/dL     Total Cholesterol: 137 mg/dL   Good morning Gentry your labs have returned and overall look normal.  I do recommend we start a low dose statin to help with stroke prevention, your LDL is 73, but with new heart diagnosis this may be a beneficial thing to add.  You can also discuss this cardiology at visit this week.  Main side effects I see are muscle aches or fatigue, I tend to start at low dose and work way up as needed. For you I think this would work well and lower side effects presenting.  Let me know if wish to start or any questions. Keep being awesome!!  Thank you for allowing me to participate in your care. Kindest regards, Tremain Rucinski

## 2020-07-04 ENCOUNTER — Encounter: Payer: Self-pay | Admitting: Cardiology

## 2020-07-04 ENCOUNTER — Other Ambulatory Visit: Payer: Self-pay

## 2020-07-04 ENCOUNTER — Ambulatory Visit: Payer: BC Managed Care – PPO | Admitting: Cardiology

## 2020-07-04 ENCOUNTER — Encounter: Payer: Self-pay | Admitting: Nurse Practitioner

## 2020-07-04 VITALS — BP 140/108 | HR 158 | Ht 72.0 in | Wt 285.0 lb

## 2020-07-04 DIAGNOSIS — M25561 Pain in right knee: Secondary | ICD-10-CM

## 2020-07-04 DIAGNOSIS — I4891 Unspecified atrial fibrillation: Secondary | ICD-10-CM

## 2020-07-04 DIAGNOSIS — M25562 Pain in left knee: Secondary | ICD-10-CM

## 2020-07-04 DIAGNOSIS — Z01812 Encounter for preprocedural laboratory examination: Secondary | ICD-10-CM

## 2020-07-04 DIAGNOSIS — G8929 Other chronic pain: Secondary | ICD-10-CM

## 2020-07-04 DIAGNOSIS — D6869 Other thrombophilia: Secondary | ICD-10-CM | POA: Insufficient documentation

## 2020-07-04 DIAGNOSIS — E669 Obesity, unspecified: Secondary | ICD-10-CM

## 2020-07-04 NOTE — H&P (View-Only) (Signed)
Electrophysiology Office Note:    Date:  07/04/2020   ID:  Joseph [name], DOB May 13, 1961, MRN [id number]  PCP:  Joseph Lick, NP  [facility] Cardiologist:  No primary care provider on file.  [facility] Electrophysiologist:  Joseph Epley, MD   Referring MD: Joseph Lick, NP   Chief Complaint: Atrial fibrillation  History of Present Illness:    Joseph [name] is a 60 y.o. male who presents for an evaluation of atrial fibrillation at the request of Joseph Guarneri, NP. Their medical history includes hiatal hernia.  The patient last saw Joseph [name] on July 02, 2020.  This was in the setting of a recent preop appointment before and schedule knee replacement surgery.  Heart rate at the preop appointment is 160 bpm so the patient was started on diltiazem and was scheduled to see his primary care.  Patient's brother has a history of atrial fibrillation.  According to the patient, the brother had an ejection fraction of 15% when he was first diagnosed with atrial fibrillation.  His brother has since undergone atrial fibrillation ablation.  The patient tells me that he does not recall ever having an ECG prior to his preoperative appointment.  He does not routinely check his heart rates so he is unclear when he actually went into atrial fibrillation.  He reports laying flat at night and not having significant swelling in his lower extremities.  No syncope or presyncope.  He is working on weight loss and has lost over 20 pounds.  He is still losing weight.  Past Medical History:  Diagnosis Date  . Hernia, hiatal    20 years ago  . Tachycardia     Past Surgical History:  Procedure Laterality Date  . COLONOSCOPY WITH PROPOFOL N/A 12/02/2019   Procedure: COLONOSCOPY WITH PROPOFOL;  Surgeon: Jonathon Bellows, MD;  Location[facility] ENDOSCOPY;  Service: Gastroenterology;  Laterality: N/A;  . HIATAL HERNIA REPAIR      Current Medications: Current Meds  Medication Sig  . apixaban  (ELIQUIS) 5 MG TABS tablet Take 1 tablet (5 mg total) by mouth 2 (two) times daily.  Marland Kitchen diltiazem (CARDIZEM CD) 180 MG 24 hr capsule Take 180 mg by mouth daily.  Marland Kitchen gabapentin (NEURONTIN) 100 MG capsule Take 100 mg by mouth 3 (three) times daily.  . rosuvastatin (CRESTOR) 10 MG tablet Take 1 tablet (10 mg total) by mouth daily.     Allergies:   Patient has no known allergies.   Social History   Socioeconomic History  . Marital status: Married    Spouse name: Not on file  . Number of children: Not on file  . Years of education: Not on file  . Highest education level: Not on file  Occupational History    Comment: [employer]  Tobacco Use  . Smoking status: Never Smoker  . Smokeless tobacco: Current User    Types: Chew  Vaping Use  . Vaping Use: Never used  Substance and Sexual Activity  . Alcohol use: Yes  . Drug use: Never  . Sexual activity: Yes  Other Topics Concern  . Not on file  Social History Narrative  . Not on file   Social Determinants of Health   Financial Resource Strain: Not on file  Food Insecurity: Not on file  Transportation Needs: Not on file  Physical Activity: Not on file  Stress: Not on file  Social Connections: Not on file     Family History: The patient's family history includes COPD  in his sister; Leukemia in his father.  ROS:   Please see the history of present illness.    All other systems reviewed and are negative.  EKGs/Labs/Other Studies Reviewed:    The following studies were reviewed today:  July 02, 2020 EKG personally reviewed Suspected atrial fibrillation   EKG:  The ekg ordered today demonstrates atrial fibrillation with rapid ventricular rates at 160 bpm.  Recent Labs: 08/12/2019: Hemoglobin 15.6; Platelets 112 07/02/2020: [name] 32; BUN 11; Creatinine, Ser 0.93; Potassium 3.9; Sodium 142; TSH 3.380  Recent Lipid Panel    Component Value Date/Time   CHOL 137 07/02/2020 1020   TRIG 91 07/02/2020 1020   HDL 47  07/02/2020 1020   CHOLHDL 3.3 08/12/2019 0837   LDLCALC 73 07/02/2020 1020    Physical Exam:    VS:  BP (!) 140/108 (BP Location: Left Arm, Patient Position: Sitting, Cuff Size: Normal)   Pulse (!) 158   Ht 6' (1.829 m)   Wt 285 lb (129.3 kg)   SpO2 97%   BMI 38.65 kg/m     Wt Readings from Last 3 Encounters:  07/04/20 285 lb (129.3 kg)  07/02/20 248 lb 12.8 oz (112.9 kg)  12/02/19 277 lb (125.6 kg)     GEN:  Well nourished, well developed in no acute distress HEENT: Normal NECK: No JVD; No carotid bruits LYMPHATICS: No lymphadenopathy CARDIAC: Irregularly irregular, tachycardic, no murmurs, rubs, gallops RESPIRATORY:  Clear to auscultation without rales, wheezing or rhonchi  ABDOMEN: Soft, non-tender, non-distended MUSCULOSKELETAL: Trivial edema to bilateral shins; No deformity  SKIN: Warm and dry NEUROLOGIC:  Alert and oriented x 3 PSYCHIATRIC:  Normal affect   ASSESSMENT:    1. Atrial fibrillation, unspecified type ([place])   2. Obesity (BMI 30-39.9)   3. Chronic pain of both knees    PLAN:    In order of problems listed above:  1. Atrial fibrillation with rapid ventricular rates Unclear duration of A. Fib. He will need an echocardiogram to assess his left ventricular function given the significantly elevated heart rates.  We will also assess for structural heart disease given his brothers history. We will continue his Eliquis.  He has not started it yet but will start it today when he gets home.  He will take this twice a day. I would like to go ahead and get him cardioverted so we will schedule a TEE/DCCV with the next available appointment. I will plan on seeing him back in 4 weeks after the cardioversion and a surface echo to discuss neck steps.  I discussed the treatment options available for his atrial fibrillation including conservative management with rate control and stroke prophylaxis with the [place].  I also discussed a rhythm control strategy using an  antiarrhythmic and using ablation.  He is not interested in long-term medication use and I think given his age this is very reasonable.  We discussed ablation at length during today's visit including the risks, expected recovery time and efficacy rates.  We will plan to discuss this further at our next appointment after we have the echocardiogram results and can assess whether not he is maintained normal rhythm after the cardioversion.  TEE and cardioversion procedure discussed in detail with the patient during today's visit include the risks and expected recovery times.  He wishes to proceed.  2.  Obesity Discussed during today's appointment the link between obesity and success of atrial fibrillation management.  He will continue to work on weight loss.  3.  Knee  pain Previously was scheduled for a right total knee replacement.  At this time he is not cleared for any orthopedic procedure given my concerns that he could have left ventricular dysfunction and significantly uncontrolled ventricular rates during atrial fibrillation.  Will reassess at our next appointment.  Follow-up 4 weeks.  Medication Adjustments/Labs and Tests Ordered: Current medicines are reviewed at length with the patient today.  Concerns regarding medicines are outlined above.  No orders of the defined types were placed in this encounter.  No orders of the defined types were placed in this encounter.    Signed, Lars Mage, MD, [facility]  07/04/2020 10:52 AM    Electrophysiology [facility]

## 2020-07-04 NOTE — Patient Instructions (Signed)
Medication Instructions:  Your physician has recommended you make the following change in your medication:   1) START Eliquis 5 mg today- take 1 tablet by mouth twice daily   *If you need a refill on your cardiac medications before your next appointment, please call your pharmacy*   Lab Work: - Pre procedure lab work: today- CBC  - Pre procedure COVID swab:Friday 07/06/20 (8:00 am- 1:00 pm) [facility] Drive up test only- staff will come out to the car to swab you   If you have labs (blood work) drawn today and your tests are completely normal, you will receive your results only by: [facility] MyChart Message (if you have MyChart) OR . A paper copy in the mail If you have any lab test that is abnormal or we need to change your treatment, we will call you to review the results.   Testing/Procedures:  1) Echocardiogram:  - Your physician has requested that you have an echocardiogram. Echocardiography is a painless test that uses sound waves to create images of your heart. It provides your doctor with information about the size and shape of your heart and how well your heart's chambers and valves are working. This procedure takes approximately one hour. There are no restrictions for this procedure.There is a possibility that an IV may need to be started during your test to inject an image enhancing agent. This is done to obtain more optimal pictures of your heart. Therefore we ask that you do at least drink some water prior to coming in to hydrate your veins.      2) TEE/ Cardioversion:  - Your physician has requested that you have a TEE/Cardioversion. During a TEE, sound waves are used to create images of your heart. It provides your doctor with information about the size and shape of your heart and how well your heart's chambers and valves are working. In this test, a transducer is attached to the end of a flexible tube that is guided down you throat and into your esophagus (the tube  leading from your mouth to your stomach) to get a more detailed image of your heart. Once the TEE has determined that a blood clot is not present, the cardioversion begins. Electrical Cardioversion uses a jolt of electricity to your heart either through paddles or wired patches attached to your chest. This is a controlled, usually prescheduled, procedure. This procedure is done at the hospital and you are not awake during the procedure. You usually go home the day of the procedure.  You are scheduled for a TEE/ Cardioversion on Tuesday 07/10/20 with Dr. Harrell Gave End.  Please arrive at the [facility] at 6:30 a.m. on the day of your procedure.  - Once you enter, proceed to the screening table, then you will go to the 1st desk on the right in the [place] (Registration) to check in for the procedure  DIET INSTRUCTIONS:  Nothing to eat or drink after midnight after midnight the night prior to your procedure         1) Labs: as above  2) Medications:  You may take all of your regular medications with enough water to get them down safely the morning of your procedure including the Eliquis  3) Must have a responsible person to drive you home.  4) Bring a current list of your medications and current insurance cards.    If you have any questions after you get home, please call the office at [phone]     Follow-Up: At [facility], you and your health needs are our priority.  As part of our continuing mission to provide you with exceptional heart care, we have created designated Provider Care Teams.  These Care Teams include your primary Cardiologist (physician) and Advanced Practice Providers (APPs -  Physician Assistants and Nurse Practitioners) who all work together to provide you with the care you need, when you need it.  We recommend signing up for the patient portal called "MyChart".  Sign up information is provided on this After Visit Summary.  MyChart is used to connect  with patients for Virtual Visits (Telemedicine).  Patients are able to view lab/test results, encounter notes, upcoming appointments, etc.  Non-urgent messages can be sent to your provider as well.   To learn more about what you can do with MyChart, go to [email or web address].    Your next appointment:   4 week(s)  The format for your next appointment:   In Person  Provider:   Lars Mage, MD   Other Instructions   Echocardiogram An echocardiogram is a test that uses sound waves (ultrasound) to produce images of the heart. Images from an echocardiogram can provide important information about:  Heart size and shape.  The size and thickness and movement of your heart's walls.  Heart muscle function and strength.  Heart valve function or if you have stenosis. Stenosis is when the heart valves are too narrow.  If blood is flowing backward through the heart valves (regurgitation).  A tumor or infectious growth around the heart valves.  Areas of heart muscle that are not working well because of poor blood flow or injury from a heart attack.  Aneurysm detection. An aneurysm is a weak or damaged part of an artery wall. The wall bulges out from the normal force of blood pumping through the body. Tell a health care provider about:  Any allergies you have.  All medicines you are taking, including vitamins, herbs, eye drops, creams, and over-the-counter medicines.  Any blood disorders you have.  Any surgeries you have had.  Any medical conditions you have.  Whether you are pregnant or may be pregnant. What are the risks? Generally, this is a safe test. However, problems may occur, including an allergic reaction to dye (contrast) that may be used during the test. What happens before the test? No specific preparation is needed. You may eat and drink normally. What happens during the test?  You will take off your clothes from the waist up and put on a hospital  gown.  Electrodes or electrocardiogram (ECG)patches may be placed on your chest. The electrodes or patches are then connected to a device that monitors your heart rate and rhythm.  You will lie down on a table for an ultrasound exam. A gel will be applied to your chest to help sound waves pass through your skin.  A handheld device, called a transducer, will be pressed against your chest and moved over your heart. The transducer produces sound waves that travel to your heart and bounce back (or "echo" back) to the transducer. These sound waves will be captured in real-time and changed into images of your heart that can be viewed on a video monitor. The images will be recorded on a computer and reviewed by your health care provider.  You may be asked to change positions or hold your breath for a short time. This makes it easier to get different views or better views of your  heart.  In some cases, you may receive contrast through an IV in one of your veins. This can improve the quality of the pictures from your heart. The procedure may vary among health care providers and hospitals.   What can I expect after the test? You may return to your normal, everyday life, including diet, activities, and medicines, unless your health care provider tells you not to do that. Follow these instructions at home:  It is up to you to get the results of your test. Ask your health care provider, or the department that is doing the test, when your results will be ready.  Keep all follow-up visits. This is important. Summary  An echocardiogram is a test that uses sound waves (ultrasound) to produce images of the heart.  Images from an echocardiogram can provide important information about the size and shape of your heart, heart muscle function, heart valve function, and other possible heart problems.  You do not need to do anything to prepare before this test. You may eat and drink normally.  After the  echocardiogram is completed, you may return to your normal, everyday life, unless your health care provider tells you not to do that. This information is not intended to replace advice given to you by your health care provider. Make sure you discuss any questions you have with your health care provider. Document Revised: 01/17/2020 Document Reviewed: 01/17/2020 Elsevier Patient Education  2021 [place].    Hospital doctor cardioversion is the delivery of a jolt of electricity to restore a normal rhythm to the heart. A rhythm that is too fast or is not regular keeps the heart from pumping well. In this procedure, sticky patches or metal paddles are placed on the chest to deliver electricity to the heart from a device. This procedure may be done in an emergency if:  There is low or no blood pressure as a result of the heart rhythm.  Normal rhythm must be restored as fast as possible to protect the brain and heart from further damage.  It may save a life. This may also be a scheduled procedure for irregular or fast heart rhythms that are not immediately life-threatening. Tell a health care provider about:  Any allergies you have.  All medicines you are taking, including vitamins, herbs, eye drops, creams, and over-the-counter medicines.  Any problems you or family members have had with anesthetic medicines.  Any blood disorders you have.  Any surgeries you have had.  Any medical conditions you have.  Whether you are pregnant or may be pregnant. What are the risks? Generally, this is a safe procedure. However, problems may occur, including:  Allergic reactions to medicines.  A blood clot that breaks free and travels to other parts of your body.  The possible return of an abnormal heart rhythm within hours or days after the procedure.  Your heart stopping (cardiac arrest). This is rare. What happens before the procedure? Medicines  Your health care  provider may have you start taking: ? Blood-thinning medicines (anticoagulants) so your blood does not clot as easily. ? Medicines to help stabilize your heart rate and rhythm.  Ask your health care provider about: ? Changing or stopping your regular medicines. This is especially important if you are taking diabetes medicines or blood thinners. ? Taking medicines such as aspirin and ibuprofen. These medicines can thin your blood. Do not take these medicines unless your health care provider tells you to take them. ? Taking over-the-counter  medicines, vitamins, herbs, and supplements. General instructions  Follow instructions from your health care provider about eating or drinking restrictions.  Plan to have someone take you home from the hospital or clinic.  If you will be going home right after the procedure, plan to have someone with you for 24 hours.  Ask your health care provider what steps will be taken to help prevent infection. These may include washing your skin with a germ-killing soap. What happens during the procedure?  An IV will be inserted into one of your veins.  Sticky patches (electrodes) or metal paddles may be placed on your chest.  You will be given a medicine to help you relax (sedative).  An electrical shock will be delivered. The procedure may vary among health care providers and hospitals.   What can I expect after the procedure?  Your blood pressure, heart rate, breathing rate, and blood oxygen level will be monitored until you leave the hospital or clinic.  Your heart rhythm will be watched to make sure it does not change.  You may have some redness on the skin where the shocks were given. Follow these instructions at home:  Do not drive for 24 hours if you were given a sedative during your procedure.  Take over-the-counter and prescription medicines only as told by your health care provider.  Ask your health care provider how to check your pulse.  Check it often.  Rest for 48 hours after the procedure or as told by your health care provider.  Avoid or limit your caffeine use as told by your health care provider.  Keep all follow-up visits as told by your health care provider. This is important. Contact a health care provider if:  You feel like your heart is beating too quickly or your pulse is not regular.  You have a serious muscle cramp that does not go away. Get help right away if:  You have discomfort in your chest.  You are dizzy or you feel faint.  You have trouble breathing or you are short of breath.  Your speech is slurred.  You have trouble moving an arm or leg on one side of your body.  Your fingers or toes turn cold or blue. Summary  Electrical cardioversion is the delivery of a jolt of electricity to restore a normal rhythm to the heart.  This procedure may be done right away in an emergency or may be a scheduled procedure if the condition is not an emergency.  Generally, this is a safe procedure.  After the procedure, check your pulse often as told by your health care provider. This information is not intended to replace advice given to you by your health care provider. Make sure you discuss any questions you have with your health care provider. Document Revised: 12/27/2018 Document Reviewed: 12/27/2018 Elsevier Patient Education  2021 [employer] Inc.  Brunwald's Heart Disease: A Textbook of Cardiovascular Medicine (11th ed., pp. 026-378). Elsevier.">  Transesophageal Echocardiogram Transesophageal echocardiogram (TEE) is a test that uses sound waves (echocardiogram) to produce very clear, detailed images of the heart and the arteries that lead to and from the heart. A TEE is done by passing a small ultrasound probe attached to a flexible tube down the mouth, through the throat, and into the esophagus. The heart is located in front of the esophagus. A TEE may be done:  To check how well your heart valves  are working.  To check for any abnormal growth or tumor.  To  look for blood clots.  To check for infection of the lining of the heart (endocarditis).  To evaluate the dividing wall (septum) of the heart and check for a hole that did not close after birth (patent foramen ovale or atrial septal defect).  To help diagnose a tear in the wall of the blood vessels (aortic dissection).  To look at the heart shape, size, and function. Any changes can be associated with certain conditions, including heart failure, aneurysm, and coronary heart disease (CHD).  During cardiac valve surgery. This allows the surgeon to assess the valve repair before closing the chest.  During a variety of other cardiac procedures to guide positioning of catheters.  To monitor your heart's response to IV fluids or medicine.  To evaluate your heart for a left atrial appendage occluder device. TEE is usually not a painful procedure. You will be given sedation for this procedure. You may feel the probe press against the back of the throat. The probe does not enter the trachea and does not affect your breathing. Tell a health care provider about:  Any allergies you have.  All medicines you are taking, including vitamins, herbs, eye drops, creams, and over-the-counter medicines.  Any problems you or family members have had with anesthetic medicines.  Any blood disorders you have.  Any surgeries you have had.  Any medical conditions you have.  Any swallowing difficulties.  Whether you have or have had a blockage of the esophagus (esophageal obstruction).  Whether you are pregnant or may be pregnant. What are the risks? Generally, this is a safe procedure. However, problems may occur, including:  Damage to nearby structures or organs.  A tear of the esophagus.  Irregular heartbeat.  Hoarse voice or difficulty swallowing.  Bleeding. What happens before the procedure? Medicines Ask your health care  provider about:  Changing or stopping your regular medicines. This is especially important if you are taking diabetes medicines or blood thinners.  Taking medicines such as aspirin and ibuprofen. These medicines can thin your blood. Do not take these medicines unless your health care provider tells you to take them.  Taking over-the-counter medicines, vitamins, herbs, and supplements. General instructions  Follow instructions from your health care provider about eating or drinking restrictions.  You will need to remove any dentures or dental retainers.  Plan to have a responsible adult take you home from the hospital or clinic.  Plan to have a responsible adult care for you for the time you are told after you leave the hospital or clinic. This is important. What happens during the procedure?  An IV will be inserted into one of your veins.  You will be given one or more of the following: ? A medicine to help you relax (sedative). ? A medicine that is sprayed or gargled to numb the back of your throat (local anesthetic).  Your blood pressure, heart rate, and breathing will be monitored during the procedure.  You may be asked to lie on your left side.  A bite block will be placed in your mouth to keep you from biting the flexible tube during the procedure.  The probe will be placed into the back of your mouth. You will be asked to swallow. This helps to pass the tip of the probe into the esophagus.  Once the tip of the probe is in the correct area, the probe sends sound waves to your heart and collects the echoes that bounce back. These echoes become pictures that show  up on a video monitor.  When the health care provider is finished taking pictures of your heart, the probe and bite block will be removed. The procedure may vary among health care providers and hospitals.   What can I expect after the procedure?  Your blood pressure, heart rate, breathing rate, and blood oxygen level  will be monitored until you leave the hospital or clinic.  When you first wake up, your throat may feel slightly sore and will probably still feel numb. This will improve slowly over time. You will not be allowed to eat or drink until the numbness has gone away as you could choke.  It is common to have a sore throat for a day or two after the procedure.  It is up to you to get the results of your procedure. Ask your health care provider, or the department that is doing the procedure, when your results will be ready. Follow these instructions at home:  If you were given a sedative during the procedure, it can affect you for several hours. Do not drive or operate machinery until your health care provider says that it is safe.  Return to your normal activities as told by your health care provider. Ask your health care provider what activities are safe for you.  Keep all follow-up visits. This is important. Summary  Transesophageal echocardiogram (TEE) is a test that uses sound waves (echocardiogram) to produce very clear, detailed images of the heart.  TEE is done by passing a small ultrasound probe attached to a flexible tube down the esophagus.  Generally, this is a safe procedure. However, problems may occur, including damage to nearby structures or organs, bleeding, irregular heartbeat, and a hoarse voice or trouble swallowing.  Tell your health care provider about any medicines you are taking and any medical conditions you have, including swallowing difficulties. This information is not intended to replace advice given to you by your health care provider. Make sure you discuss any questions you have with your health care provider. Document Revised: 01/31/2020 Document Reviewed: 01/17/2020 Elsevier Patient Education  2021 [employer].

## 2020-07-04 NOTE — Progress Notes (Signed)
Electrophysiology Office Note:    Date:  07/04/2020   ID:  Delray Alt, DOB May 13, 1961, MRN [id number]  PCP:  Venita Lick, NP  [facility] Cardiologist:  No primary care provider on file.  [facility] Electrophysiologist:  Vickie Epley, MD   Referring MD: Venita Lick, NP   Chief Complaint: Atrial fibrillation  History of Present Illness:    Joseph [name] is a 60 y.o. male who presents for an evaluation of atrial fibrillation at the request of Marnee Guarneri, NP. Their medical history includes hiatal hernia.  The patient last saw Marnee Guarneri on July 02, 2020.  This was in the setting of a recent preop appointment before and schedule knee replacement surgery.  Heart rate at the preop appointment is 160 bpm so the patient was started on diltiazem and was scheduled to see his primary care.  Patient's brother has a history of atrial fibrillation.  According to the patient, the brother had an ejection fraction of 15% when he was first diagnosed with atrial fibrillation.  His brother has since undergone atrial fibrillation ablation.  The patient tells me that he does not recall ever having an ECG prior to his preoperative appointment.  He does not routinely check his heart rates so he is unclear when he actually went into atrial fibrillation.  He reports laying flat at night and not having significant swelling in his lower extremities.  No syncope or presyncope.  He is working on weight loss and has lost over 20 pounds.  He is still losing weight.  Past Medical History:  Diagnosis Date  . Hernia, hiatal    20 years ago  . Tachycardia     Past Surgical History:  Procedure Laterality Date  . COLONOSCOPY WITH PROPOFOL N/A 12/02/2019   Procedure: COLONOSCOPY WITH PROPOFOL;  Surgeon: Jonathon Bellows, MD;  Location: [facility] ENDOSCOPY;  Service: Gastroenterology;  Laterality: N/A;  . HIATAL HERNIA REPAIR      Current Medications: Current Meds  Medication Sig  . apixaban  (ELIQUIS) 5 MG TABS tablet Take 1 tablet (5 mg total) by mouth 2 (two) times daily.  Marland Kitchen diltiazem (CARDIZEM CD) 180 MG 24 hr capsule Take 180 mg by mouth daily.  Marland Kitchen gabapentin (NEURONTIN) 100 MG capsule Take 100 mg by mouth 3 (three) times daily.  . rosuvastatin (CRESTOR) 10 MG tablet Take 1 tablet (10 mg total) by mouth daily.     Allergies:   Patient has no known allergies.   Social History   Socioeconomic History  . Marital status: Married    Spouse name: Not on file  . Number of children: Not on file  . Years of education: Not on file  . Highest education level: Not on file  Occupational History    Comment: [employer]  Tobacco Use  . Smoking status: Never Smoker  . Smokeless tobacco: Current User    Types: Chew  Vaping Use  . Vaping Use: Never used  Substance and Sexual Activity  . Alcohol use: Yes  . Drug use: Never  . Sexual activity: Yes  Other Topics Concern  . Not on file  Social History Narrative  . Not on file   Social Determinants of Health   Financial Resource Strain: Not on file  Food Insecurity: Not on file  Transportation Needs: Not on file  Physical Activity: Not on file  Stress: Not on file  Social Connections: Not on file     Family History: The patient's family history includes COPD  in his sister; Leukemia in his father.  ROS:   Please see the history of present illness.    All other systems reviewed and are negative.  EKGs/Labs/Other Studies Reviewed:    The following studies were reviewed today:  July 02, 2020 EKG personally reviewed Suspected atrial fibrillation   EKG:  The ekg ordered today demonstrates atrial fibrillation with rapid ventricular rates at 160 bpm.  Recent Labs: 08/12/2019: Hemoglobin 15.6; Platelets 112 07/02/2020: ALT 32; BUN 11; Creatinine, Ser 0.93; Potassium 3.9; Sodium 142; TSH 3.380  Recent Lipid Panel    Component Value Date/Time   CHOL 137 07/02/2020 1020   TRIG 91 07/02/2020 1020   HDL 47  07/02/2020 1020   CHOLHDL 3.3 08/12/2019 0837   LDLCALC 73 07/02/2020 1020    Physical Exam:    VS:  BP (!) 140/108 (BP Location: Left Arm, Patient Position: Sitting, Cuff Size: Normal)   Pulse (!) 158   Ht 6' (1.829 m)   Wt 285 lb (129.3 kg)   SpO2 97%   BMI 38.65 kg/m     Wt Readings from Last 3 Encounters:  07/04/20 285 lb (129.3 kg)  07/02/20 248 lb 12.8 oz (112.9 kg)  12/02/19 277 lb (125.6 kg)     GEN:  Well nourished, well developed in no acute distress HEENT: Normal NECK: No JVD; No carotid bruits LYMPHATICS: No lymphadenopathy CARDIAC: Irregularly irregular, tachycardic, no murmurs, rubs, gallops RESPIRATORY:  Clear to auscultation without rales, wheezing or rhonchi  ABDOMEN: Soft, non-tender, non-distended MUSCULOSKELETAL: Trivial edema to bilateral shins; No deformity  SKIN: Warm and dry NEUROLOGIC:  Alert and oriented x 3 PSYCHIATRIC:  Normal affect   ASSESSMENT:    1. Atrial fibrillation, unspecified type ([place])   2. Obesity (BMI 30-39.9)   3. Chronic pain of both knees    PLAN:    In order of problems listed above:  1. Atrial fibrillation with rapid ventricular rates Unclear duration of A. Fib. He will need an echocardiogram to assess his left ventricular function given the significantly elevated heart rates.  We will also assess for structural heart disease given his brothers history. We will continue his Eliquis.  He has not started it yet but will start it today when he gets home.  He will take this twice a day. I would like to go ahead and get him cardioverted so we will schedule a TEE/DCCV with the next available appointment. I will plan on seeing him back in 4 weeks after the cardioversion and a surface echo to discuss neck steps.  I discussed the treatment options available for his atrial fibrillation including conservative management with rate control and stroke prophylaxis with the Kulm.  I also discussed a rhythm control strategy using an  antiarrhythmic and using ablation.  He is not interested in long-term medication use and I think given his age this is very reasonable.  We discussed ablation at length during today's visit including the risks, expected recovery time and efficacy rates.  We will plan to discuss this further at our next appointment after we have the echocardiogram results and can assess whether not he is maintained normal rhythm after the cardioversion.  TEE and cardioversion procedure discussed in detail with the patient during today's visit include the risks and expected recovery times.  He wishes to proceed.  2.  Obesity Discussed during today's appointment the link between obesity and success of atrial fibrillation management.  He will continue to work on weight loss.  3.  Knee  pain Previously was scheduled for a right total knee replacement.  At this time he is not cleared for any orthopedic procedure given my concerns that he could have left ventricular dysfunction and significantly uncontrolled ventricular rates during atrial fibrillation.  Will reassess at our next appointment.  Follow-up 4 weeks.  Medication Adjustments/Labs and Tests Ordered: Current medicines are reviewed at length with the patient today.  Concerns regarding medicines are outlined above.  No orders of the defined types were placed in this encounter.  No orders of the defined types were placed in this encounter.    Signed, Lars Mage, MD, [facility]  07/04/2020 10:52 AM    Electrophysiology [facility]

## 2020-07-05 LAB — CBC WITH DIFFERENTIAL/PLATELET
Basophils Absolute: 0.1 10*3/uL (ref 0.0–0.2)
Basos: 1 %
EOS (ABSOLUTE): 0.2 10*3/uL (ref 0.0–0.4)
Eos: 2 %
Hematocrit: 45.5 % (ref 37.5–51.0)
Hemoglobin: 15.7 g/dL (ref 13.0–17.7)
Immature Grans (Abs): 0 10*3/uL (ref 0.0–0.1)
Immature Granulocytes: 0 %
Lymphocytes Absolute: 1.2 10*3/uL (ref 0.7–3.1)
Lymphs: 16 %
MCH: 35.4 pg — ABNORMAL HIGH (ref 26.6–33.0)
MCHC: 34.5 g/dL (ref 31.5–35.7)
MCV: 103 fL — ABNORMAL HIGH (ref 79–97)
Monocytes Absolute: 0.8 10*3/uL (ref 0.1–0.9)
Monocytes: 10 %
Neutrophils Absolute: 5.5 10*3/uL (ref 1.4–7.0)
Neutrophils: 71 %
Platelets: 149 10*3/uL — ABNORMAL LOW (ref 150–450)
RBC: 4.44 x10E6/uL (ref 4.14–5.80)
RDW: 10.9 % — ABNORMAL LOW (ref 11.6–15.4)
WBC: 7.8 10*3/uL (ref 3.4–10.8)

## 2020-07-06 ENCOUNTER — Other Ambulatory Visit
Admission: RE | Admit: 2020-07-06 | Discharge: 2020-07-06 | Disposition: A | Discharge status: Home / Self Care | Payer: BC Managed Care – PPO | Source: Ambulatory Visit | Attending: Internal Medicine | Admitting: Internal Medicine

## 2020-07-06 ENCOUNTER — Other Ambulatory Visit: Payer: Self-pay

## 2020-07-06 DIAGNOSIS — Z01812 Encounter for preprocedural laboratory examination: Secondary | ICD-10-CM | POA: Insufficient documentation

## 2020-07-06 DIAGNOSIS — Z20822 Contact with and (suspected) exposure to covid-19: Secondary | ICD-10-CM | POA: Diagnosis not present

## 2020-07-07 LAB — SARS CORONAVIRUS 2 (TAT 6-24 HRS): SARS Coronavirus 2: NEGATIVE

## 2020-07-09 MED ORDER — SODIUM CHLORIDE 0.9 % IV SOLN: INTRAVENOUS | Status: DC

## 2020-07-10 ENCOUNTER — Encounter
Admission: RE | Disposition: A | Discharge status: Home / Self Care | Payer: Self-pay | Source: Home / Self Care | Attending: Internal Medicine

## 2020-07-10 ENCOUNTER — Ambulatory Visit
Admission: RE | Admit: 2020-07-10 | Discharge: 2020-07-10 | Disposition: A | Discharge status: Home / Self Care | Payer: BC Managed Care – PPO | Attending: Internal Medicine | Admitting: Internal Medicine

## 2020-07-10 ENCOUNTER — Other Ambulatory Visit: Payer: Self-pay | Admitting: Nurse Practitioner

## 2020-07-10 ENCOUNTER — Encounter: Payer: Self-pay | Admitting: Internal Medicine

## 2020-07-10 ENCOUNTER — Ambulatory Visit: Payer: BC Managed Care – PPO | Admitting: Anesthesiology

## 2020-07-10 ENCOUNTER — Ambulatory Visit (HOSPITAL_BASED_OUTPATIENT_CLINIC_OR_DEPARTMENT_OTHER)
Admission: RE | Admit: 2020-07-10 | Discharge: 2020-07-10 | Disposition: A | Discharge status: Home / Self Care | Payer: BC Managed Care – PPO | Source: Home / Self Care | Attending: Internal Medicine | Admitting: Internal Medicine

## 2020-07-10 ENCOUNTER — Other Ambulatory Visit: Payer: Self-pay

## 2020-07-10 DIAGNOSIS — M25561 Pain in right knee: Secondary | ICD-10-CM | POA: Diagnosis not present

## 2020-07-10 DIAGNOSIS — F17290 Nicotine dependence, other tobacco product, uncomplicated: Secondary | ICD-10-CM | POA: Insufficient documentation

## 2020-07-10 DIAGNOSIS — M25562 Pain in left knee: Secondary | ICD-10-CM | POA: Insufficient documentation

## 2020-07-10 DIAGNOSIS — I4891 Unspecified atrial fibrillation: Secondary | ICD-10-CM | POA: Diagnosis not present

## 2020-07-10 DIAGNOSIS — E669 Obesity, unspecified: Secondary | ICD-10-CM | POA: Diagnosis not present

## 2020-07-10 DIAGNOSIS — K449 Diaphragmatic hernia without obstruction or gangrene: Secondary | ICD-10-CM | POA: Diagnosis not present

## 2020-07-10 DIAGNOSIS — I4819 Other persistent atrial fibrillation: Secondary | ICD-10-CM | POA: Diagnosis not present

## 2020-07-10 DIAGNOSIS — I340 Nonrheumatic mitral (valve) insufficiency: Secondary | ICD-10-CM

## 2020-07-10 DIAGNOSIS — Z7901 Long term (current) use of anticoagulants: Secondary | ICD-10-CM | POA: Diagnosis not present

## 2020-07-10 DIAGNOSIS — Q211 Atrial septal defect: Secondary | ICD-10-CM | POA: Insufficient documentation

## 2020-07-10 DIAGNOSIS — I361 Nonrheumatic tricuspid (valve) insufficiency: Secondary | ICD-10-CM | POA: Diagnosis not present

## 2020-07-10 DIAGNOSIS — Z6838 Body mass index (BMI) 38.0-38.9, adult: Secondary | ICD-10-CM | POA: Insufficient documentation

## 2020-07-10 DIAGNOSIS — Z79899 Other long term (current) drug therapy: Secondary | ICD-10-CM | POA: Diagnosis not present

## 2020-07-10 DIAGNOSIS — I10 Essential (primary) hypertension: Secondary | ICD-10-CM | POA: Diagnosis not present

## 2020-07-10 DIAGNOSIS — D6869 Other thrombophilia: Secondary | ICD-10-CM | POA: Diagnosis not present

## 2020-07-10 DIAGNOSIS — I081 Rheumatic disorders of both mitral and tricuspid valves: Secondary | ICD-10-CM | POA: Diagnosis not present

## 2020-07-10 DIAGNOSIS — R0683 Snoring: Secondary | ICD-10-CM

## 2020-07-10 HISTORY — PX: CARDIOVERSION: SHX1299

## 2020-07-10 HISTORY — PX: TEE WITHOUT CARDIOVERSION: SHX5443

## 2020-07-10 SURGERY — ECHOCARDIOGRAM, TRANSESOPHAGEAL
Anesthesia: General

## 2020-07-10 MED ORDER — APIXABAN 5 MG PO TABS
5.0000 mg | ORAL_TABLET | Freq: Once | ORAL | Status: AC
  Administered 2020-07-10: 5 mg via ORAL
  Filled 2020-07-10: qty 1

## 2020-07-10 MED ORDER — LIDOCAINE VISCOUS HCL 2 % MT SOLN
OROMUCOSAL | Status: AC
  Filled 2020-07-10: qty 15

## 2020-07-10 MED ORDER — METOPROLOL SUCCINATE ER 25 MG PO TB24: 12.5000 mg | ORAL_TABLET | Freq: Every day | ORAL | 5 refills | Status: DC

## 2020-07-10 MED ORDER — PROPOFOL 10 MG/ML IV BOLUS
INTRAVENOUS | Status: DC | PRN
  Administered 2020-07-10: 40 mg via INTRAVENOUS
  Administered 2020-07-10: 30 mg via INTRAVENOUS
  Administered 2020-07-10: 60 mg via INTRAVENOUS
  Administered 2020-07-10: 20 mg via INTRAVENOUS
  Administered 2020-07-10: 40 mg via INTRAVENOUS
  Administered 2020-07-10: 30 mg via INTRAVENOUS

## 2020-07-10 NOTE — Progress Notes (Signed)
*  PRELIMINARY RESULTS* Echocardiogram Echocardiogram Transesophageal has been performed.  Sherrie Sport 07/10/2020, 8:13 AM

## 2020-07-10 NOTE — CV Procedure (Addendum)
    Transesophageal Echocardiogram Note  Joseph [name] [id number] May 21, 1961  Procedure: Transesophageal Echocardiogram Indications: Persistent atrial flutter  Procedure Details Consent: Obtained Time Out: Verified patient identification, verified procedure, site/side was marked, verified correct patient position, special equipment/implants available, Radiology Safety Procedures followed,  medications/allergies/relevent history reviewed, required imaging and test results available.  Performed  Medications:  During this procedure the patient is administered propofol by anesthesia services.  Left Ventrical:  Global hypokinesis with LVEF ~25%.  Mitral Valve: Normal with mild regurgitation.  Aortic Valve: Tricuspid.  No AI.  Tricuspid Valve: Normal with mild TR.  Pulmonic Valve: Normal.  No PR.  Left Atrium/ Left atrial appendage: No thrombus.  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  Negative bubble study.  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   Complications: No apparent complications Patient did tolerate procedure well.   Nelva Bush, MD 07/10/2020, 8:05 AM

## 2020-07-10 NOTE — Anesthesia Preprocedure Evaluation (Addendum)
Anesthesia Evaluation  Patient identified by MRN, date of birth, ID band Patient awake    Reviewed: Allergy & Precautions, H&P , NPO status , Patient's Chart, lab work & pertinent test results  History of Anesthesia Complications Negative for: history of anesthetic complications  Airway Mallampati: III  TM Distance: >3 FB Neck ROM: full    Dental  (+) Chipped   Pulmonary neg pulmonary ROS, neg shortness of breath,    Pulmonary exam normal        Cardiovascular Exercise Tolerance: Good (-) angina(-) Past MI and (-) DOE + dysrhythmias Atrial Fibrillation  Rhythm:irregular Rate:Tachycardia     Neuro/Psych negative neurological ROS  negative psych ROS   GI/Hepatic negative GI ROS, Neg liver ROS, hiatal hernia, neg GERD  ,  Endo/Other  negative endocrine ROS  Renal/GU negative Renal ROS  negative genitourinary   Musculoskeletal   Abdominal   Peds  Hematology negative hematology ROS (+)   Anesthesia Other Findings Past Medical History: No date: Hernia, hiatal     Comment:  20 years ago No date: Tachycardia  Past Surgical History: 12/02/2019: COLONOSCOPY WITH PROPOFOL; N/A     Comment:  Procedure: COLONOSCOPY WITH PROPOFOL;  Surgeon: Jonathon Bellows, MD;  Location: [facility] ENDOSCOPY;  Service:               Gastroenterology;  Laterality: N/A; No date: HIATAL HERNIA REPAIR  BMI    Body Mass Index: 37.97 kg/m      Reproductive/Obstetrics negative OB ROS                             Anesthesia Physical Anesthesia Plan  ASA: III  Anesthesia Plan: General and MAC   Post-op Pain Management:    Induction: Intravenous  PONV Risk Score and Plan: Propofol infusion and TIVA  Airway Management Planned: Natural Airway and Nasal Cannula  Additional Equipment:   Intra-op Plan:   Post-operative Plan:   Informed Consent: I have reviewed the patients History and Physical, chart,  labs and discussed the procedure including the risks, benefits and alternatives for the proposed anesthesia with the patient or authorized representative who has indicated his/her understanding and acceptance.     Dental Advisory Given  Plan Discussed with: Anesthesiologist, CRNA and Surgeon  Anesthesia Plan Comments: (Patient consented for risks of anesthesia including but not limited to:  - adverse reactions to medications - risk of airway placement if required - damage to eyes, teeth, lips or other oral mucosa - nerve damage due to positioning  - sore throat or hoarseness - Damage to heart, brain, nerves, lungs, other parts of body or loss of life  Patient voiced understanding.)       Anesthesia Quick Evaluation

## 2020-07-10 NOTE — Addendum Note (Signed)
Addendum  created 07/10/20 0901 by Athen Riel, Precious Haws, MD   Clinical Note Signed

## 2020-07-10 NOTE — Transfer of Care (Signed)
Immediate Anesthesia Transfer of Care Note  Patient: Joseph [name]  Procedure(s) Performed: TEE with Cardioversion (N/A ) CARDIOVERSION (N/A )  Patient Location: PACU  Anesthesia Type:General  Level of Consciousness: sedated  Airway & Oxygen Therapy: Patient Spontanous Breathing and Patient connected to nasal cannula oxygen. SPO2 in low 90s prior to procedure. MDA and bedside nurse aware.  Will monitor closely   Post-op Assessment: Report given to RN and Post -op Vital signs reviewed and stable  Post vital signs: Reviewed and stable  Last Vitals:  Vitals Value Taken Time  BP 121/86 07/10/20 0800  Temp    Pulse 80 07/10/20 0802  Resp 27 07/10/20 0802  SpO2 88 % 07/10/20 0802  Vitals shown include unvalidated device data.  Last Pain:  Vitals:   07/10/20 0702  TempSrc: Oral  PainSc: 0-No pain         Complications: No complications documented.

## 2020-07-10 NOTE — Interval H&P Note (Signed)
History and Physical Interval Note:  07/10/2020 7:36 AM  Delray Alt  has presented today for surgery, with the diagnosis of persistent atrial fibrillation.  The various methods of treatment have been discussed with the patient and family. After consideration of risks, benefits and other options for treatment, the patient has consented to  Procedure(s): TEE with Cardioversion (N/A) CARDIOVERSION (N/A) as a surgical intervention.  The patient's history has been reviewed, patient examined, no change in status, stable for surgery.  I have reviewed the patient's chart and labs.  Questions were answered to the patient's satisfaction.     Heather Mckendree

## 2020-07-10 NOTE — Anesthesia Postprocedure Evaluation (Addendum)
Anesthesia Post Note  Patient: Jermaine Neuharth  Procedure(s) Performed: TEE with Cardioversion (N/A ) CARDIOVERSION (N/A )  Patient location during evaluation: Specials Recovery Anesthesia Type: General Level of consciousness: awake and alert Pain management: pain level controlled Vital Signs Assessment: post-procedure vital signs reviewed and stable Respiratory status: spontaneous breathing, nonlabored ventilation, respiratory function stable and patient connected to nasal cannula oxygen Cardiovascular status: blood pressure returned to baseline and stable Postop Assessment: no apparent nausea or vomiting Anesthetic complications: no   No complications documented.   Last Vitals:  Vitals:   07/10/20 0830 07/10/20 0842  BP: (!) 120/91 (!) 122/95  Pulse: 78 81  Resp: (!) 26 18  Temp:    SpO2: 96% 93%    Last Pain:  Vitals:   07/10/20 0842  TempSrc:   PainSc: 0-No pain                 Precious Haws Illana Nolting

## 2020-07-10 NOTE — CV Procedure (Signed)
    Cardioversion Note  Joseph [name] [id number] 1960-08-22  Procedure: DC Cardioversion Indications: Persistent atrial fibrillation  Procedure Details Consent: Obtained Time Out: Verified patient identification, verified procedure, site/side was marked, verified correct patient position, special equipment/implants available, Radiology Safety Procedures followed,  medications/allergies/relevent history reviewed, required imaging and test results available.  Performed  The patient has been on adequate anticoagulation.  The patient received IV propofol by anesthesia for sedation.  Synchronous cardioversion was performed at 200 joules x 1.  The cardioversion was successful with restoration of normal sinus rhythm.  Complications: No apparent complications Patient did tolerate procedure well.  Nelva Bush., MD 07/10/2020, 8:07 AM

## 2020-07-11 ENCOUNTER — Encounter: Payer: Self-pay | Admitting: Internal Medicine

## 2020-07-11 ENCOUNTER — Encounter: Payer: Self-pay | Admitting: *Deleted

## 2020-07-16 NOTE — Progress Notes (Deleted)
   Office Visit    Patient Name: Joseph [name] Date of Encounter: 07/16/2020  Primary Care Provider:  Venita Lick, NP Primary Cardiologist:  No primary care provider on file. Electrophysiologist:  Vickie Epley, MD   Chief Complaint    Joseph [name] is a 60 y.o. male with a hx of atrial fibrillation, hiatal hernia *** presents today for follow up after cardioversion.   Past Medical History    Past Medical History:  Diagnosis Date  . Hernia, hiatal    20 years ago  . Tachycardia    Past Surgical History:  Procedure Laterality Date  . CARDIOVERSION N/A 07/10/2020   Procedure: CARDIOVERSION;  Surgeon: Nelva Bush, MD;  Location: [facility] ORS;  Service: Cardiovascular;  Laterality: N/A;  . COLONOSCOPY WITH PROPOFOL N/A 12/02/2019   Procedure: COLONOSCOPY WITH PROPOFOL;  Surgeon: Jonathon Bellows, MD;  Location: [facility] ENDOSCOPY;  Service: Gastroenterology;  Laterality: N/A;  . HIATAL HERNIA REPAIR    . TEE WITHOUT CARDIOVERSION N/A 07/10/2020   Procedure: TEE with Cardioversion;  Surgeon: Nelva Bush, MD;  Location: [facility] ORS;  Service: Cardiovascular;  Laterality: N/A;    Allergies  No Known Allergies  History of Present Illness    Joseph [name] is a 60 y.o. male with a hx of *** last seen for cardioversion 07/10/20.  He was seen for preop appointment prior to knee surgery and was started on Diltiazem due to heart rate of 160 bpm. Noted family history of atrial fibrillation in his brother who underwent atrial fibrillation ablation. He was seen by Dr. Quentin Ore in consult 07/04/20. He was recommended to continue Eliquis 5mg  BID,   ***  EKGs/Labs/Other Studies Reviewed:   The following studies were reviewed today: ***  EKG:  EKG is ordered today.  The ekg ordered today demonstrates ***  Recent Labs: 07/02/2020: ALT 32; BUN 11; Creatinine, Ser 0.93; Potassium 3.9; Sodium 142; TSH 3.380 07/04/2020: Hemoglobin 15.7; Platelets 149  Recent Lipid Panel    Component Value Date/Time    CHOL 137 07/02/2020 1020   TRIG 91 07/02/2020 1020   HDL 47 07/02/2020 1020   CHOLHDL 3.3 08/12/2019 0837   LDLCALC 73 07/02/2020 1020    Risk Assessment/Calculations:  {Does this patient have ATRIAL FIBRILLATION?:[phone]}  Home Medications   No outpatient medications have been marked as taking for the 07/19/20 encounter (Appointment) with Loel Dubonnet, NP.     Review of Systems   ***   ROS All other systems reviewed and are otherwise negative except as noted above.  Physical Exam    VS:  There were no vitals taken for this visit. , BMI There is no height or weight on file to calculate BMI.  Wt Readings from Last 3 Encounters:  07/10/20 280 lb (127 kg)  07/04/20 285 lb (129.3 kg)  07/02/20 248 lb 12.8 oz (112.9 kg)     GEN: Well nourished, well developed, in no acute distress. HEENT: normal. Neck: Supple, no JVD, carotid bruits, or masses. Cardiac: ***RRR, no murmurs, rubs, or gallops. No clubbing, cyanosis, edema.  ***Radials/DP/PT 2+ and equal bilaterally.  Respiratory:  ***Respirations regular and unlabored, clear to auscultation bilaterally. GI: Soft, nontender, nondistended. MS: No deformity or atrophy. Skin: Warm and dry, no rash. Neuro:  Strength and sensation are intact. Psych: Normal affect.  Assessment & Plan    1. ***  Disposition: Follow up {follow up:15908} with Dr. Saunders Revel or APP   Signed, Loel Dubonnet, NP 07/16/2020, 12:27 PM [place]

## 2020-07-19 ENCOUNTER — Ambulatory Visit: Payer: BC Managed Care – PPO | Admitting: Family

## 2020-07-19 ENCOUNTER — Other Ambulatory Visit: Payer: Self-pay

## 2020-07-19 ENCOUNTER — Ambulatory Visit (INDEPENDENT_AMBULATORY_CARE_PROVIDER_SITE_OTHER): Payer: BC Managed Care – PPO | Admitting: Cardiovascular Disease

## 2020-07-19 ENCOUNTER — Encounter: Payer: Self-pay | Admitting: Cardiovascular Disease

## 2020-07-19 ENCOUNTER — Ambulatory Visit: Payer: BC Managed Care – PPO

## 2020-07-19 VITALS — BP 132/92 | HR 133 | Ht 72.0 in | Wt 280.0 lb

## 2020-07-19 DIAGNOSIS — E785 Hyperlipidemia, unspecified: Secondary | ICD-10-CM | POA: Diagnosis not present

## 2020-07-19 DIAGNOSIS — I43 Cardiomyopathy in diseases classified elsewhere: Secondary | ICD-10-CM | POA: Diagnosis not present

## 2020-07-19 DIAGNOSIS — I4891 Unspecified atrial fibrillation: Secondary | ICD-10-CM

## 2020-07-19 DIAGNOSIS — R000 Tachycardia, unspecified: Secondary | ICD-10-CM | POA: Diagnosis not present

## 2020-07-19 MED ORDER — METOPROLOL SUCCINATE ER 50 MG PO TB24: 50.0000 mg | ORAL_TABLET | Freq: Every day | ORAL | 1 refills | Status: DC

## 2020-07-19 MED ORDER — AMIODARONE HCL 200 MG PO TABS: ORAL_TABLET | ORAL | 0 refills | Status: DC

## 2020-07-19 NOTE — H&P (View-Only) (Signed)
Cardiology Office Note   Date:  07/19/2020   ID:  Joseph [name], DOB May 10, 1961, MRN [id number]  PCP:  Venita Lick, NP  Cardiologist: Dr. Quentin Ore  No chief complaint on file.     History of Present Illness: Joseph [name] is a 60 y.o. male who was added to my schedule today due to recurrent atrial fibrillation with rapid ventricular response.  The patient was coming for an echocardiogram but was noted to be tachycardic. The patient was seen recently by Dr. Quentin Ore for newly diagnosed atrial fibrillation which was discovered on EKG done before planned knee replacement surgery.  The patient's brother has history of atrial fibrillation.  He did have cardiomyopathy and required ablation.  The patient was started on Eliquis and small dose Toprol.  He underwent successful TEE guided cardioversion on February 1 and converted sinus rhythm.  TEE showed an EF of 25 to 30%, mild to moderate mitral regurgitation, mild to moderate tricuspid regurgitation and mildly dilated aortic root. The patient started having palpitations and anxiety feelings last night with some shortness of breath.  No chest pain. He is scheduled for a sleep study next month.  He has symptoms suggestive of sleep apnea.  He has not missed any dose of anticoagulation.   Past Medical History:  Diagnosis Date  . Hernia, hiatal    20 years ago  . Tachycardia     Past Surgical History:  Procedure Laterality Date  . CARDIOVERSION N/A 07/10/2020   Procedure: CARDIOVERSION;  Surgeon: Nelva Bush, MD;  Location: [facility] ORS;  Service: Cardiovascular;  Laterality: N/A;  . COLONOSCOPY WITH PROPOFOL N/A 12/02/2019   Procedure: COLONOSCOPY WITH PROPOFOL;  Surgeon: Jonathon Bellows, MD;  Location: [facility] ENDOSCOPY;  Service: Gastroenterology;  Laterality: N/A;  . HIATAL HERNIA REPAIR    . TEE WITHOUT CARDIOVERSION N/A 07/10/2020   Procedure: TEE with Cardioversion;  Surgeon: Nelva Bush, MD;  Location: [facility] ORS;  Service: Cardiovascular;   Laterality: N/A;     Current Outpatient Medications  Medication Sig Dispense Refill  . acetaminophen (TYLENOL) 500 MG tablet Take 500 mg by mouth every 6 (six) hours as needed for mild pain or moderate pain.    Marland Kitchen amiodarone (PACERONE) 200 MG tablet Take 2 tablets (400 mg) twice a day for 5 days. Then Reduce to 1 tablet (200 mg) twice a day. 70 tablet 0  . apixaban (ELIQUIS) 5 MG TABS tablet Take 1 tablet (5 mg total) by mouth 2 (two) times daily. 60 tablet 3  . gabapentin (NEURONTIN) 100 MG capsule Take 100 mg by mouth 3 (three) times daily.    Marland Kitchen ibuprofen (ADVIL) 200 MG tablet Take 400 mg by mouth every 6 (six) hours as needed for mild pain or moderate pain.    . rosuvastatin (CRESTOR) 10 MG tablet Take 1 tablet (10 mg total) by mouth daily. 90 tablet 3  . metoprolol succinate (TOPROL XL) 50 MG 24 hr tablet Take 1 tablet (50 mg total) by mouth daily. 30 tablet 1   No current facility-administered medications for this visit.    Allergies:   Patient has no known allergies.    Social History:  The patient  reports that he has never smoked. His smokeless tobacco use includes chew. He reports current alcohol use. He reports that he does not use drugs.   Family History:  The patient's family history includes COPD in his sister; Leukemia in his father.    ROS:  Please see the history of present illness.  Otherwise, review of systems are positive for none.   All other systems are reviewed and negative.    PHYSICAL EXAM: VS:  BP (!) 132/92   Pulse (!) 133   Ht 6' (1.829 m)   Wt 280 lb (127 kg)   BMI 37.97 kg/m  , BMI Body mass index is 37.97 kg/m. GEN: Well nourished, well developed, in no acute distress  HEENT: normal  Neck: no JVD, carotid bruits, or masses Cardiac: Irregularly irregular and tachycardic; no murmurs, rubs, or gallops,no edema  Respiratory:  clear to auscultation bilaterally, normal work of breathing GI: soft, nontender, nondistended, + BS MS: no deformity or  atrophy  Skin: warm and dry, no rash Neuro:  Strength and sensation are intact Psych: euthymic mood, full affect   EKG:  EKG is ordered today. The ekg ordered today demonstrates atrial fibrillation with rapid ventricular response   Recent Labs: 07/02/2020: ALT 32; BUN 11; Creatinine, Ser 0.93; Potassium 3.9; Sodium 142; TSH 3.380 07/04/2020: Hemoglobin 15.7; Platelets 149    Lipid Panel    Component Value Date/Time   CHOL 137 07/02/2020 1020   TRIG 91 07/02/2020 1020   HDL 47 07/02/2020 1020   CHOLHDL 3.3 08/12/2019 0837   LDLCALC 73 07/02/2020 1020      Wt Readings from Last 3 Encounters:  07/19/20 280 lb (127 kg)  07/10/20 280 lb (127 kg)  07/04/20 285 lb (129.3 kg)       PAD Screen 07/04/2020  Previous PAD dx? No  Previous surgical procedure? No  Pain with walking? No  Feet/toe relief with dangling? No  Painful, non-healing ulcers? No  Extremities discolored? No      ASSESSMENT AND PLAN:  1.  Recurrent atrial fibrillation with rapid ventricular response: The patient had successful cardioversion earlier this month but he is back in A. fib with RVR.  His degree of cardiomyopathy is also concerning which is likely tachycardia induced. I suspect that the patient had does have underlying sleep apnea that is driving his atrial fibrillation.  We do need to get him back in normal rhythm as soon as possible given degree of cardiomyopathy.  I think the best option is to add amiodarone as an antiarrhythmic medication at least for the short-term.  I added amiodarone 400 mg twice a day for 5 days then down to 200 mg twice daily.  I increase Toprol to 50 mg once daily for better rate control. Continue anticoagulation with Eliquis.  I am going to reschedule cardioversion in 10 days from now.  I discussed the procedure in details as well as risk and benefits.  No need for repeat TEE given that there has been no interruption in his anticoagulation. Amiodarone is not a good long-term  option for him considering his age.  However, I am hoping to use this short-term in order to get him in sinus rhythm and improve his EF before he can undergo ablation.  2.  Hyperlipidemia: On rosuvastatin 10 mg daily.  3.  Probable sleep apnea: I suspect that he has underlying sleep apnea driving his atrial fibrillation.  He is scheduled for sleep study next month.  4.  Obesity: Likely contributing to the above.  5.  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  We have to reevaluate his ejection fraction once he is maintaining in sinus rhythm for a few weeks.  If EF does not recover, he will need ischemic cardiac evaluation.   Disposition:   Proceed with repeat cardioversion and follow-up with Dr. Quentin Ore  after that to discuss ablation.  Signed,  Kathlyn Sacramento, MD  07/19/2020 12:13 PM    [place]

## 2020-07-19 NOTE — Progress Notes (Signed)
Cardiology Office Note   Date:  07/19/2020   ID:  Joseph [name], DOB 1961/02/20, MRN [id number]  PCP:  Venita Lick, NP  Cardiologist: Dr. Quentin Ore  No chief complaint on file.     History of Present Illness: Joseph [name] is a 60 y.o. male who was added to my schedule today due to recurrent atrial fibrillation with rapid ventricular response.  The patient was coming for an echocardiogram but was noted to be tachycardic. The patient was seen recently by Dr. Quentin Ore for newly diagnosed atrial fibrillation which was discovered on EKG done before planned knee replacement surgery.  The patient's brother has history of atrial fibrillation.  He did have cardiomyopathy and required ablation.  The patient was started on Eliquis and small dose Toprol.  He underwent successful TEE guided cardioversion on February 1 and converted sinus rhythm.  TEE showed an EF of 25 to 30%, mild to moderate mitral regurgitation, mild to moderate tricuspid regurgitation and mildly dilated aortic root. The patient started having palpitations and anxiety feelings last night with some shortness of breath.  No chest pain. He is scheduled for a sleep study next month.  He has symptoms suggestive of sleep apnea.  He has not missed any dose of anticoagulation.   Past Medical History:  Diagnosis Date   Hernia, hiatal    20 years ago   Tachycardia     Past Surgical History:  Procedure Laterality Date   CARDIOVERSION N/A 07/10/2020   Procedure: CARDIOVERSION;  Surgeon: Nelva Bush, MD;  Location: [facility] ORS;  Service: Cardiovascular;  Laterality: N/A;   COLONOSCOPY WITH PROPOFOL N/A 12/02/2019   Procedure: COLONOSCOPY WITH PROPOFOL;  Surgeon: Jonathon Bellows, MD;  Location: [facility] ENDOSCOPY;  Service: Gastroenterology;  Laterality: N/A;   HIATAL HERNIA REPAIR     TEE WITHOUT CARDIOVERSION N/A 07/10/2020   Procedure: TEE with Cardioversion;  Surgeon: Nelva Bush, MD;  Location: [facility] ORS;  Service: Cardiovascular;   Laterality: N/A;     Current Outpatient Medications  Medication Sig Dispense Refill   acetaminophen (TYLENOL) 500 MG tablet Take 500 mg by mouth every 6 (six) hours as needed for mild pain or moderate pain.     amiodarone (PACERONE) 200 MG tablet Take 2 tablets (400 mg) twice a day for 5 days. Then Reduce to 1 tablet (200 mg) twice a day. 70 tablet 0   apixaban (ELIQUIS) 5 MG TABS tablet Take 1 tablet (5 mg total) by mouth 2 (two) times daily. 60 tablet 3   gabapentin (NEURONTIN) 100 MG capsule Take 100 mg by mouth 3 (three) times daily.     ibuprofen (ADVIL) 200 MG tablet Take 400 mg by mouth every 6 (six) hours as needed for mild pain or moderate pain.     rosuvastatin (CRESTOR) 10 MG tablet Take 1 tablet (10 mg total) by mouth daily. 90 tablet 3   metoprolol succinate (TOPROL XL) 50 MG 24 hr tablet Take 1 tablet (50 mg total) by mouth daily. 30 tablet 1   No current facility-administered medications for this visit.    Allergies:   Patient has no known allergies.    Social History:  The patient  reports that he has never smoked. His smokeless tobacco use includes chew. He reports current alcohol use. He reports that he does not use drugs.   Family History:  The patient's family history includes COPD in his sister; Leukemia in his father.    ROS:  Please see the history of present illness.  Otherwise, review of systems are positive for none.   All other systems are reviewed and negative.    PHYSICAL EXAM: VS:  BP (!) 132/92    Pulse (!) 133    Ht 6' (1.829 m)    Wt 280 lb (127 kg)    BMI 37.97 kg/m  , BMI Body mass index is 37.97 kg/m. GEN: Well nourished, well developed, in no acute distress  HEENT: normal  Neck: no JVD, carotid bruits, or masses Cardiac: Irregularly irregular and tachycardic; no murmurs, rubs, or gallops,no edema  Respiratory:  clear to auscultation bilaterally, normal work of breathing GI: soft, nontender, nondistended, + BS MS: no deformity or  atrophy  Skin: warm and dry, no rash Neuro:  Strength and sensation are intact Psych: euthymic mood, full affect   EKG:  EKG is ordered today. The ekg ordered today demonstrates atrial fibrillation with rapid ventricular response   Recent Labs: 07/02/2020: ALT 32; BUN 11; Creatinine, Ser 0.93; Potassium 3.9; Sodium 142; TSH 3.380 07/04/2020: Hemoglobin 15.7; Platelets 149    Lipid Panel    Component Value Date/Time   CHOL 137 07/02/2020 1020   TRIG 91 07/02/2020 1020   HDL 47 07/02/2020 1020   CHOLHDL 3.3 08/12/2019 0837   LDLCALC 73 07/02/2020 1020      Wt Readings from Last 3 Encounters:  07/19/20 280 lb (127 kg)  07/10/20 280 lb (127 kg)  07/04/20 285 lb (129.3 kg)       PAD Screen 07/04/2020  Previous PAD dx? No  Previous surgical procedure? No  Pain with walking? No  Feet/toe relief with dangling? No  Painful, non-healing ulcers? No  Extremities discolored? No      ASSESSMENT AND PLAN:  1.  Recurrent atrial fibrillation with rapid ventricular response: The patient had successful cardioversion earlier this month but he is back in A. fib with RVR.  His degree of cardiomyopathy is also concerning which is likely tachycardia induced. I suspect that the patient had does have underlying sleep apnea that is driving his atrial fibrillation.  We do need to get him back in normal rhythm as soon as possible given degree of cardiomyopathy.  I think the best option is to add amiodarone as an antiarrhythmic medication at least for the short-term.  I added amiodarone 400 mg twice a day for 5 days then down to 200 mg twice daily.  I increase Toprol to 50 mg once daily for better rate control. Continue anticoagulation with Eliquis.  I am going to reschedule cardioversion in 10 days from now.  I discussed the procedure in details as well as risk and benefits.  No need for repeat TEE given that there has been no interruption in his anticoagulation. Amiodarone is not a good long-term  option for him considering his age.  However, I am hoping to use this short-term in order to get him in sinus rhythm and improve his EF before he can undergo ablation.  2.  Hyperlipidemia: On rosuvastatin 10 mg daily.  3.  Probable sleep apnea: I suspect that he has underlying sleep apnea driving his atrial fibrillation.  He is scheduled for sleep study next month.  4.  Obesity: Likely contributing to the above.  5.  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  We have to reevaluate his ejection fraction once he is maintaining in sinus rhythm for a few weeks.  If EF does not recover, he will need ischemic cardiac evaluation.   Disposition:   Proceed with repeat cardioversion and  follow-up with Dr. Quentin Ore after that to discuss ablation.  Signed,  Kathlyn Sacramento, MD  07/19/2020 12:13 PM    [place]

## 2020-07-19 NOTE — Patient Instructions (Signed)
Medication Instructions:  Your physician has recommended you make the following change in your medication:   INCREASE Metoprolol Succinate to 50 mg daily. An Rx has been sent to your pharmacy.  START Amiodarone 400 mg (2 tablets) twice a day for 5 days. The REDUCE to 200 mg (1 tablet) twice a day. An Rx has been sent to your pharmacy.  *If you need a refill on your cardiac medications before your next appointment, please call your pharmacy*   Lab Work: Bmp and Cbc today.  You will need a COVID test on 07/27/20. Please report to the [facility] medical arts building drive up test site between 8am-1pm.  We ask that you self quarantine after the test up to the day of your procedure.   If you have labs (blood work) drawn today and your tests are completely normal, you will receive your results only by: Marland Kitchen MyChart Message (if you have MyChart) OR . A paper copy in the mail If you have any lab test that is abnormal or we need to change your treatment, we will call you to review the results.   Testing/Procedures: Your physician has recommended that you have a Cardioversion (DCCV). Electrical Cardioversion uses a jolt of electricity to your heart either through paddles or wired patches attached to your chest. This is a controlled, usually prescheduled, procedure. Defibrillation is done under light anesthesia in the hospital, and you usually go home the day of the procedure. This is done to get your heart back into a normal rhythm. You are not awake for the procedure. Please see the instruction sheet given to you today.     Follow-Up: At [facility], you and your health needs are our priority.  As part of our continuing mission to provide you with exceptional heart care, we have created designated Provider Care Teams.  These Care Teams include your primary Cardiologist (physician) and Advanced Practice Providers (APPs -  Physician Assistants and Nurse Practitioners) who all work together to provide  you with the care you need, when you need it.  We recommend signing up for the patient portal called "MyChart".  Sign up information is provided on this After Visit Summary.  MyChart is used to connect with patients for Virtual Visits (Telemedicine).  Patients are able to view lab/test results, encounter notes, upcoming appointments, etc.  Non-urgent messages can be sent to your provider as well.   To learn more about what you can do with MyChart, go to [email or web address].    Your next appointment:   4 week(s)  The format for your next appointment:   In Person  Provider:   You may see Dr. Quentin Ore or one of the following Advanced Practice Providers on your designated Care Team:    Murray Hodgkins, NP  Christell Faith, PA-C  Marrianne Mood, PA-C  Cadence Kathlen Mody, [place]  Laurann Montana, NP    Other Instructions  You are scheduled for a Cardioversion on  Mon 07/30/20 with Dr. Fletcher Anon Please arrive at the [facility] at 7:30 a.m. on the day of your procedure.  DIET INSTRUCTIONS:  Nothing to eat or drink after midnight except your medications with a              sip of water.         1) Labs: today (cbc, bmp). You will need a COVID test on 07/27/20 between 8am-1pm. Please report to the [place] drive up test site.  2) Medications:  YOU MAY TAKE  ALL of your remaining medications with a small amount of water.  3) Must have a responsible person to drive you home.  4) Bring a current list of your medications and current insurance cards.    If you have any questions after you get home, please call the office at [phone]

## 2020-07-20 LAB — BASIC METABOLIC PANEL
BUN/Creatinine Ratio: 11 (ref 9–20)
BUN: 9 mg/dL (ref 6–24)
CO2: 25 mmol/L (ref 20–29)
Calcium: 9.3 mg/dL (ref 8.7–10.2)
Chloride: 101 mmol/L (ref 96–106)
Creatinine, Ser: 0.85 mg/dL (ref 0.76–1.27)
GFR calc Af Amer: 110 mL/min/{1.73_m2} (ref >59)
GFR calc non Af Amer: 95 mL/min/{1.73_m2} (ref >59)
Glucose: 95 mg/dL (ref 65–99)
Potassium: 4.3 mmol/L (ref 3.5–5.2)
Sodium: 141 mmol/L (ref 134–144)

## 2020-07-20 LAB — CBC WITH DIFFERENTIAL/PLATELET
Basophils Absolute: 0.1 10*3/uL (ref 0.0–0.2)
Basos: 1 %
EOS (ABSOLUTE): 0.3 10*3/uL (ref 0.0–0.4)
Eos: 3 %
Hematocrit: 47.6 % (ref 37.5–51.0)
Hemoglobin: 16.4 g/dL (ref 13.0–17.7)
Immature Grans (Abs): 0 10*3/uL (ref 0.0–0.1)
Immature Granulocytes: 0 %
Lymphocytes Absolute: 1.5 10*3/uL (ref 0.7–3.1)
Lymphs: 14 %
MCH: 35.3 pg — ABNORMAL HIGH (ref 26.6–33.0)
MCHC: 34.5 g/dL (ref 31.5–35.7)
MCV: 103 fL — ABNORMAL HIGH (ref 79–97)
Monocytes Absolute: 0.8 10*3/uL (ref 0.1–0.9)
Monocytes: 8 %
Neutrophils Absolute: 7.7 10*3/uL — ABNORMAL HIGH (ref 1.4–7.0)
Neutrophils: 74 %
Platelets: 159 10*3/uL (ref 150–450)
RBC: 4.64 x10E6/uL (ref 4.14–5.80)
RDW: 11.7 % (ref 11.6–15.4)
WBC: 10.4 10*3/uL (ref 3.4–10.8)

## 2020-07-24 NOTE — Addendum Note (Signed)
Addended by: Ernie Hew D on: 07/24/2020 03:25 PM   Modules accepted: Orders

## 2020-07-27 ENCOUNTER — Other Ambulatory Visit
Admission: RE | Admit: 2020-07-27 | Discharge: 2020-07-27 | Disposition: A | Discharge status: Home / Self Care | Payer: BC Managed Care – PPO | Source: Ambulatory Visit | Attending: Cardiovascular Disease | Admitting: Cardiovascular Disease

## 2020-07-27 ENCOUNTER — Other Ambulatory Visit: Payer: Self-pay

## 2020-07-27 DIAGNOSIS — E669 Obesity, unspecified: Secondary | ICD-10-CM | POA: Diagnosis not present

## 2020-07-27 DIAGNOSIS — Z20822 Contact with and (suspected) exposure to covid-19: Secondary | ICD-10-CM | POA: Insufficient documentation

## 2020-07-27 DIAGNOSIS — Z6837 Body mass index (BMI) 37.0-37.9, adult: Secondary | ICD-10-CM | POA: Diagnosis not present

## 2020-07-27 DIAGNOSIS — I4891 Unspecified atrial fibrillation: Secondary | ICD-10-CM | POA: Diagnosis not present

## 2020-07-27 DIAGNOSIS — Z7901 Long term (current) use of anticoagulants: Secondary | ICD-10-CM | POA: Diagnosis not present

## 2020-07-27 DIAGNOSIS — Z01812 Encounter for preprocedural laboratory examination: Secondary | ICD-10-CM | POA: Insufficient documentation

## 2020-07-27 DIAGNOSIS — Z79899 Other long term (current) drug therapy: Secondary | ICD-10-CM | POA: Diagnosis not present

## 2020-07-27 LAB — SARS CORONAVIRUS 2 (TAT 6-24 HRS): SARS Coronavirus 2: NEGATIVE

## 2020-07-30 ENCOUNTER — Ambulatory Visit: Payer: BC Managed Care – PPO | Admitting: Certified Registered"

## 2020-07-30 ENCOUNTER — Encounter: Payer: Self-pay | Admitting: Cardiovascular Disease

## 2020-07-30 ENCOUNTER — Other Ambulatory Visit: Payer: Self-pay

## 2020-07-30 ENCOUNTER — Encounter
Admission: RE | Disposition: A | Discharge status: Home / Self Care | Payer: Self-pay | Source: Home / Self Care | Attending: Cardiovascular Disease

## 2020-07-30 ENCOUNTER — Ambulatory Visit
Admission: RE | Admit: 2020-07-30 | Discharge: 2020-07-30 | Disposition: A | Discharge status: Home / Self Care | Payer: BC Managed Care – PPO | Attending: Cardiovascular Disease | Admitting: Cardiovascular Disease

## 2020-07-30 DIAGNOSIS — I4891 Unspecified atrial fibrillation: Secondary | ICD-10-CM | POA: Diagnosis not present

## 2020-07-30 DIAGNOSIS — Z6837 Body mass index (BMI) 37.0-37.9, adult: Secondary | ICD-10-CM | POA: Insufficient documentation

## 2020-07-30 DIAGNOSIS — Z20822 Contact with and (suspected) exposure to covid-19: Secondary | ICD-10-CM | POA: Insufficient documentation

## 2020-07-30 DIAGNOSIS — Z7901 Long term (current) use of anticoagulants: Secondary | ICD-10-CM | POA: Diagnosis not present

## 2020-07-30 DIAGNOSIS — I4819 Other persistent atrial fibrillation: Secondary | ICD-10-CM | POA: Diagnosis not present

## 2020-07-30 DIAGNOSIS — E669 Obesity, unspecified: Secondary | ICD-10-CM | POA: Diagnosis not present

## 2020-07-30 DIAGNOSIS — Z79899 Other long term (current) drug therapy: Secondary | ICD-10-CM | POA: Diagnosis not present

## 2020-07-30 HISTORY — PX: CARDIOVERSION: SHX1299

## 2020-07-30 SURGERY — CARDIOVERSION
Anesthesia: General

## 2020-07-30 MED ORDER — SODIUM CHLORIDE 0.9 % IV SOLN: INTRAVENOUS | Status: DC | PRN

## 2020-07-30 MED ORDER — PROPOFOL 10 MG/ML IV BOLUS
INTRAVENOUS | Status: AC
  Filled 2020-07-30: qty 20

## 2020-07-30 MED ORDER — PROPOFOL 10 MG/ML IV BOLUS
INTRAVENOUS | Status: DC | PRN
  Administered 2020-07-30: 30 mg via INTRAVENOUS
  Administered 2020-07-30 (×3): 50 mg via INTRAVENOUS

## 2020-07-30 NOTE — Interval H&P Note (Signed)
History and Physical Interval Note:  07/30/2020 8:05 AM  Joseph [name]  has presented today for surgery, with the diagnosis of Cardioversion   Afib.  The various methods of treatment have been discussed with the patient and family. After consideration of risks, benefits and other options for treatment, the patient has consented to  Procedure(s): CARDIOVERSION (N/A) as a surgical intervention.  The patient's history has been reviewed, patient examined, no change in status, stable for surgery.  I have reviewed the patient's chart and labs.  Questions were answered to the patient's satisfaction.     Kathlyn Sacramento

## 2020-07-30 NOTE — Anesthesia Postprocedure Evaluation (Signed)
Anesthesia Post Note  Patient: Joseph [name]  Procedure(s) Performed: CARDIOVERSION (N/A )  Patient location during evaluation: Specials Recovery Anesthesia Type: General Level of consciousness: awake and alert Pain management: pain level controlled Vital Signs Assessment: post-procedure vital signs reviewed and stable Respiratory status: spontaneous breathing and respiratory function stable Cardiovascular status: stable Anesthetic complications: no   No complications documented.   Last Vitals:  Vitals:   07/30/20 0757 07/30/20 0800  BP: (!) 114/102 (!) 117/93  Pulse: 90 93  Resp: (!) 23 (!) 24  Temp:    SpO2: 94% 94%    Last Pain:  Vitals:   07/30/20 0800  TempSrc:   PainSc: 0-No pain                 Theone Bowell K

## 2020-07-30 NOTE — Anesthesia Preprocedure Evaluation (Signed)
Anesthesia Evaluation  Patient identified by MRN, date of birth, ID band Patient awake    Reviewed: Allergy & Precautions, NPO status , Patient's Chart, lab work & pertinent test results  History of Anesthesia Complications Negative for: history of anesthetic complications  Airway Mallampati: III       Dental   Pulmonary neg sleep apnea, neg COPD, Not current smoker,           Cardiovascular (-) hypertension(-) Past MI and (-) CHF + dysrhythmias Atrial Fibrillation (-) Valvular Problems/Murmurs     Neuro/Psych neg Seizures    GI/Hepatic Neg liver ROS, neg GERD  ,  Endo/Other  neg diabetes  Renal/GU negative Renal ROS     Musculoskeletal   Abdominal   Peds  Hematology   Anesthesia Other Findings   Reproductive/Obstetrics                             Anesthesia Physical Anesthesia Plan  ASA: III  Anesthesia Plan: General   Post-op Pain Management:    Induction: Intravenous  PONV Risk Score and Plan: 2 and Propofol infusion and TIVA  Airway Management Planned: Nasal Cannula  Additional Equipment:   Intra-op Plan:   Post-operative Plan:   Informed Consent: I have reviewed the patients History and Physical, chart, labs and discussed the procedure including the risks, benefits and alternatives for the proposed anesthesia with the patient or authorized representative who has indicated his/her understanding and acceptance.       Plan Discussed with:   Anesthesia Plan Comments:         Anesthesia Quick Evaluation

## 2020-07-30 NOTE — Transfer of Care (Signed)
Immediate Anesthesia Transfer of Care Note  Patient: Joseph [name]  Procedure(s) Performed: CARDIOVERSION (N/A )  Patient Location: PACU and Cath Lab  Anesthesia Type:General  Level of Consciousness: drowsy  Airway & Oxygen Therapy: Patient Spontanous Breathing  Post-op Assessment: Report given to RN  Post vital signs: stable  Last Vitals:  Vitals Value Taken Time  BP 113/83 07/30/20 0745  Temp    Pulse 89 07/30/20 0749  Resp 25 07/30/20 0749  SpO2 97 % 07/30/20 0749    Last Pain:  Vitals:   07/30/20 0701  TempSrc: Oral  PainSc: 0-No pain         Complications: No complications documented.

## 2020-07-30 NOTE — CV Procedure (Signed)
Cardioversion note: A standard informed consent was obtained. Timeout was performed. The pads were placed in the anterior posterior fashion. The patient was given propofol by the anesthesia team.  He had significant desaturation with propofol that required a nonrebreather. Cardioversion was performed with a 200 J shock x3.  Each time he converted sinus rhythm for few beats and then quickly went back into atrial fibrillation. The patient tolerated the procedure with no immediate complications.  Recommendations: Unsuccessful cardioversion from atrial fibrillation.  The patient was shocked 3 times.  Each time, he had sinus beats for about 5 seconds and then went back into atrial fibrillation.  He had significant desaturations with propofol due to what seems to be untreated sleep apnea. The patient is scheduled for a sleep study in March and then follow-up with Dr. Quentin Ore.  Restoration of sinus rhythm might not be possible until he is fully loaded with amiodarone and his sleep apnea is treated.

## 2020-07-31 ENCOUNTER — Encounter: Payer: Self-pay | Admitting: Cardiovascular Disease

## 2020-08-07 ENCOUNTER — Institutional Professional Consult (permissible substitution): Payer: BC Managed Care – PPO | Admitting: Neurology

## 2020-08-08 ENCOUNTER — Ambulatory Visit: Payer: BC Managed Care – PPO | Admitting: Cardiology

## 2020-08-08 ENCOUNTER — Encounter: Payer: Self-pay | Admitting: Cardiology

## 2020-08-08 ENCOUNTER — Other Ambulatory Visit: Payer: Self-pay

## 2020-08-08 VITALS — BP 148/100 | HR 103 | Ht 72.0 in | Wt 275.0 lb

## 2020-08-08 DIAGNOSIS — E669 Obesity, unspecified: Secondary | ICD-10-CM

## 2020-08-08 DIAGNOSIS — I4819 Other persistent atrial fibrillation: Secondary | ICD-10-CM | POA: Diagnosis not present

## 2020-08-08 DIAGNOSIS — I5022 Chronic systolic (congestive) heart failure: Secondary | ICD-10-CM | POA: Diagnosis not present

## 2020-08-08 DIAGNOSIS — R000 Tachycardia, unspecified: Secondary | ICD-10-CM

## 2020-08-08 DIAGNOSIS — I43 Cardiomyopathy in diseases classified elsewhere: Secondary | ICD-10-CM

## 2020-08-08 NOTE — Patient Instructions (Addendum)
Medication Instructions:  Your physician has recommended you make the following change in your medication:   1.  Continue amiodarone 200 mg-  Take one tablet by mouth TWICE a day  *If you need a refill on your cardiac medications before your next appointment, please call your pharmacy*  Lab Work: None ordered. If you have labs (blood work) drawn today and your tests are completely normal, you will receive your results only by: Marland Kitchen MyChart Message (if you have MyChart) OR . A paper copy in the mail If you have any lab test that is abnormal or we need to change your treatment, we will call you to review the results.  Testing/Procedures: Your physician has recommended that you have a Cardioversion (DCCV). Electrical Cardioversion uses a jolt of electricity to your heart either through paddles or wired patches attached to your chest. This is a controlled, usually prescheduled, procedure. Defibrillation is done under light anesthesia in the hospital, and you usually go home the day of the procedure. This is done to get your heart back into a normal rhythm. You are not awake for the procedure. Please see the instruction sheet given to you today.  We will get you scheduled for a cardioversion in 5-6 weeks  Follow-Up: At [facility], you and your health needs are our priority.  As part of our continuing mission to provide you with exceptional heart care, we have created designated Provider Care Teams.  These Care Teams include your primary Cardiologist (physician) and Advanced Practice Providers (APPs -  Physician Assistants and Nurse Practitioners) who all work together to provide you with the care you need, when you need it.  Your next appointment:   Your physician wants you to follow-up in: 4 weeks with Dr. Quentin Ore after your cardioversion

## 2020-08-08 NOTE — Progress Notes (Signed)
Electrophysiology Office Follow up Visit Note:    Date:  08/08/2020   ID:  Delray Alt, DOB 1961-01-19, MRN [id number]  PCP:  Venita Lick, NP  [facility] Cardiologist:  No primary care provider on file.  [facility] Electrophysiologist:  Vickie Epley, MD    Interval History:    Joseph [name] is a 60 y.o. male who presents for a follow up visit.  I last saw the patient in clinic July 04, 2020 for his atrial fibrillation.  He was started on Eliquis the day of our last appointment on July 04, 2020.  I scheduled a cardioversion after our last appointment.  On July 30, 2020 he underwent cardioversion with Dr. Fletcher Anon.  He was shocked 3 times at 200 J.  With each shock he converted to sinus rhythm for a few beats and then went back in atrial fibrillation. He was loaded on amiodarone. Since starting amiodarone he is tolerating the medication well without any off target effects.  Since I last saw the patient he also had an echocardiogram which demonstrated a severely decreased left ventricular function.  He tells me for the last 2 days he has been unable to work because he is felt really poorly.  He has been coughing and coughing up phlegm which sounds like pulmonary edema.  The patient continues to work on weight loss and is now down to 275 pounds.  Past Medical History:  Diagnosis Date  . Hernia, hiatal    20 years ago  . Tachycardia     Past Surgical History:  Procedure Laterality Date  . CARDIOVERSION N/A 07/10/2020   Procedure: CARDIOVERSION;  Surgeon: Nelva Bush, MD;  Location: [facility] ORS;  Service: Cardiovascular;  Laterality: N/A;  . CARDIOVERSION N/A 07/30/2020   Procedure: CARDIOVERSION;  Surgeon: Wellington Hampshire, MD;  Location: [facility] ORS;  Service: Cardiovascular;  Laterality: N/A;  . COLONOSCOPY WITH PROPOFOL N/A 12/02/2019   Procedure: COLONOSCOPY WITH PROPOFOL;  Surgeon: Jonathon Bellows, MD;  Location: [facility] ENDOSCOPY;  Service: Gastroenterology;  Laterality:  N/A;  . HIATAL HERNIA REPAIR    . TEE WITHOUT CARDIOVERSION N/A 07/10/2020   Procedure: TEE with Cardioversion;  Surgeon: Nelva Bush, MD;  Location: [facility] ORS;  Service: Cardiovascular;  Laterality: N/A;    Current Medications: Current Meds  Medication Sig  . acetaminophen (TYLENOL) 500 MG tablet Take 1,000 mg by mouth every 6 (six) hours as needed for mild pain or moderate pain.  Marland Kitchen amiodarone (PACERONE) 200 MG tablet Take 200 mg by mouth 2 (two) times daily.  Marland Kitchen apixaban (ELIQUIS) 5 MG TABS tablet Take 1 tablet (5 mg total) by mouth 2 (two) times daily.  Marland Kitchen gabapentin (NEURONTIN) 100 MG capsule Take 100 mg by mouth 3 (three) times daily.  . metoprolol succinate (TOPROL XL) 50 MG 24 hr tablet Take 1 tablet (50 mg total) by mouth daily.  . Naproxen Sodium 220 MG CAPS Take 440 mg by mouth daily as needed (Joint pain).  . rosuvastatin (CRESTOR) 10 MG tablet Take 1 tablet (10 mg total) by mouth daily.  . [DISCONTINUED] amiodarone (PACERONE) 200 MG tablet Take 2 tablets (400 mg) twice a day for 5 days. Then Reduce to 1 tablet (200 mg) twice a day. (Patient taking differently: Take 200 mg by mouth 2 (two) times daily.)     Allergies:   Patient has no known allergies.   Social History   Socioeconomic History  . Marital status: Married    Spouse name: Not on file  .  Number of children: Not on file  . Years of education: Not on file  . Highest education level: Not on file  Occupational History    Comment: [employer]  Tobacco Use  . Smoking status: Never Smoker  . Smokeless tobacco: Current User    Types: Chew  Vaping Use  . Vaping Use: Never used  Substance and Sexual Activity  . Alcohol use: Yes  . Drug use: Never  . Sexual activity: Yes  Other Topics Concern  . Not on file  Social History Narrative  . Not on file   Social Determinants of Health   Financial Resource Strain: Not on file  Food Insecurity: Not on file  Transportation Needs: Not on file  Physical  Activity: Not on file  Stress: Not on file  Social Connections: Not on file     Family History: The patient's family history includes COPD in his sister; Leukemia in his father.  ROS:   Please see the history of present illness.    All other systems reviewed and are negative.  EKGs/Labs/Other Studies Reviewed:    The following studies were reviewed today:  July 10, 2020 TEE, personally reviewed Left ventricular function severely decreased, 25%.  Global hypokinesis Moderate mitral regurgitation Small secundum atrial septal defect with left-to-right shunting  EKG:  The ekg ordered today demonstrates atrial fibrillation with a ventricular rate of 103 bpm.  There is an incomplete right bundle branch block.  Recent Labs: 07/02/2020: ALT 32; TSH 3.380 07/19/2020: BUN 9; Creatinine, Ser 0.85; Hemoglobin 16.4; Platelets 159; Potassium 4.3; Sodium 141  Recent Lipid Panel    Component Value Date/Time   CHOL 137 07/02/2020 1020   TRIG 91 07/02/2020 1020   HDL 47 07/02/2020 1020   CHOLHDL 3.3 08/12/2019 0837   LDLCALC 73 07/02/2020 1020    Physical Exam:    VS:  BP (!) 148/100 (BP Location: Left Arm, Patient Position: Sitting, Cuff Size: Normal)   Pulse (!) 103   Ht 6' (1.829 m)   Wt 275 lb (124.7 kg)   SpO2 97%   BMI 37.30 kg/m     Wt Readings from Last 3 Encounters:  08/08/20 275 lb (124.7 kg)  07/30/20 275 lb (124.7 kg)  07/19/20 280 lb (127 kg)     GEN:  Well nourished, well developed in no acute distress HEENT: Normal NECK: No JVD; No carotid bruits LYMPHATICS: No lymphadenopathy CARDIAC: Irregularly irregular, no murmurs, rubs, gallops RESPIRATORY:  Clear to auscultation without rales, wheezing or rhonchi  ABDOMEN: Soft, non-tender, non-distended MUSCULOSKELETAL:  No edema; No deformity  SKIN: Warm and dry NEUROLOGIC:  Alert and oriented x 3 PSYCHIATRIC:  Normal affect   ASSESSMENT:    1. Atrial fibrillation, unspecified type ([place])    PLAN:    In order  of problems listed above:  1. Persistent atrial fibrillation Patient with persistent atrial fibrillation which is highly symptomatic and is likely tachycardia mediated cardiomyopathy with the function is severely decreased.*Given that he is complaining of symptoms suggestive of pulmonary edema.  A rhythm control strategy is indicated.  Unfortunately he has failed cardioversion recently.  He was started on amiodarone.  I would recommend that we continue amiodarone for the next few weeks and retry cardioversion.  Given his young age, amiodarone is not a good long-term strategy.  I would favor continuing amiodarone to get him back in normal rhythm and plan for an atrial fibrillation ablation.  In the interim, he will plan on having his sleep study performed  and continuing to lose weight.  He will continue taking Eliquis for stroke prophylaxis.  I will plan to see him about 1 week after his cardioversion to see how he is feeling and to determine whether or not he is maintaining normal rhythm.  2.  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  For now, recommend continuing his metoprolol, amiodarone, Eliquis.  I would like to start him on losartan which we will call in for him.  Follow-up 1 week after cardioversion.  Medication Adjustments/Labs and Tests Ordered: Current medicines are reviewed at length with the patient today.  Concerns regarding medicines are outlined above.  Orders Placed This Encounter  Procedures  . EKG 12-Lead   No orders of the defined types were placed in this encounter.    Signed, Lars Mage, MD, [facility]  08/08/2020 6:55 PM    Electrophysiology [facility]

## 2020-08-09 ENCOUNTER — Telehealth: Payer: Self-pay | Admitting: Cardiology

## 2020-08-09 ENCOUNTER — Telehealth: Payer: Self-pay

## 2020-08-09 DIAGNOSIS — I4819 Other persistent atrial fibrillation: Secondary | ICD-10-CM

## 2020-08-09 MED ORDER — LOSARTAN POTASSIUM 25 MG PO TABS: 25.0000 mg | ORAL_TABLET | Freq: Every day | ORAL | 3 refills | Status: DC

## 2020-08-09 NOTE — Telephone Encounter (Signed)
Per avs schedule after dccv . Please advise when scheduled.

## 2020-08-10 ENCOUNTER — Other Ambulatory Visit: Payer: Self-pay | Admitting: Cardiovascular Disease

## 2020-08-10 DIAGNOSIS — I4891 Unspecified atrial fibrillation: Secondary | ICD-10-CM

## 2020-08-13 NOTE — Telephone Encounter (Signed)
L mom to schedule echo

## 2020-08-13 NOTE — Telephone Encounter (Signed)
-----   Message from Damian Leavell, RN sent at 08/09/2020 10:10 AM EST ----- Regarding: RE: please advise They should still be able to see what they need to see.  And if we were to ablate him we need a prior echo.  Even if in afib.  :Laureen Abrahams ----- Message ----- From: Elissa Hefty Sent: 08/09/2020  10:06 AM EST To: Damian Leavell, RN Subject: RE: please advise                              Ok, he was asking, even though he is in afib? ----- Message ----- From: Damian Leavell, RN Sent: 08/09/2020  10:04 AM EST To: Elissa Hefty Subject: RE: please advise                              It can be before.  We need to see the size of his left atrium to think about how to treat his afib. :D  ----- Message ----- From: Elissa Hefty Sent: 08/09/2020   9:58 AM EST To: Damian Leavell, RN Subject: FW: please advise                              Sonia Baller, Should the echo should be after the cardioversion, or before. Please help ----- Message ----- From: Lamar Laundry, RN Sent: 08/07/2020  12:49 PM EST To: Elissa Hefty Subject: RE: please advise                              This was ordered by Dr. Quentin Ore.  I think based on his documentation. Yes.  "I will plan on seeing him back in 4 weeks after the cardioversion and a surface echo to discuss neck steps." ----- Message ----- From: Elissa Hefty Sent: 08/07/2020  12:40 PM EST To: Lamar Laundry, RN Subject: please advise                                  Can you look at this and see if patient  needs echo? Looks like he had a cardioversion and not sure if he still needs an echo. Thanks.

## 2020-08-15 NOTE — Telephone Encounter (Signed)
Pt scheduled for DCCV on April 8  Labs/covid test April 6  Instruction letter sent via mychart.  Request sent to scheduling to schedule 1 week f/u with Dr. Quentin Ore  Work up complete

## 2020-08-17 ENCOUNTER — Ambulatory Visit: Payer: BC Managed Care – PPO | Admitting: Nurse Practitioner

## 2020-09-12 ENCOUNTER — Other Ambulatory Visit
Admission: RE | Admit: 2020-09-12 | Discharge: 2020-09-12 | Disposition: A | Discharge status: Home / Self Care | Payer: BC Managed Care – PPO | Source: Ambulatory Visit | Attending: Cardiology | Admitting: Cardiology

## 2020-09-12 ENCOUNTER — Other Ambulatory Visit: Payer: Self-pay

## 2020-09-12 DIAGNOSIS — R000 Tachycardia, unspecified: Secondary | ICD-10-CM | POA: Diagnosis not present

## 2020-09-12 DIAGNOSIS — Z8719 Personal history of other diseases of the digestive system: Secondary | ICD-10-CM | POA: Diagnosis not present

## 2020-09-12 DIAGNOSIS — Z20822 Contact with and (suspected) exposure to covid-19: Secondary | ICD-10-CM | POA: Insufficient documentation

## 2020-09-12 DIAGNOSIS — I429 Cardiomyopathy, unspecified: Secondary | ICD-10-CM | POA: Diagnosis not present

## 2020-09-12 DIAGNOSIS — I5022 Chronic systolic (congestive) heart failure: Secondary | ICD-10-CM | POA: Diagnosis not present

## 2020-09-12 DIAGNOSIS — I4892 Unspecified atrial flutter: Secondary | ICD-10-CM | POA: Diagnosis not present

## 2020-09-12 DIAGNOSIS — Z7901 Long term (current) use of anticoagulants: Secondary | ICD-10-CM | POA: Diagnosis not present

## 2020-09-12 DIAGNOSIS — Z79899 Other long term (current) drug therapy: Secondary | ICD-10-CM | POA: Diagnosis not present

## 2020-09-12 DIAGNOSIS — I4891 Unspecified atrial fibrillation: Secondary | ICD-10-CM | POA: Diagnosis not present

## 2020-09-12 DIAGNOSIS — Z01812 Encounter for preprocedural laboratory examination: Secondary | ICD-10-CM | POA: Insufficient documentation

## 2020-09-12 LAB — SARS CORONAVIRUS 2 (TAT 6-24 HRS): SARS Coronavirus 2: NEGATIVE

## 2020-09-14 ENCOUNTER — Ambulatory Visit: Payer: BC Managed Care – PPO | Admitting: Anesthesiology

## 2020-09-14 ENCOUNTER — Encounter
Admission: RE | Disposition: A | Discharge status: Home / Self Care | Payer: Self-pay | Source: Home / Self Care | Attending: Cardiovascular Disease

## 2020-09-14 ENCOUNTER — Ambulatory Visit
Admission: RE | Admit: 2020-09-14 | Discharge: 2020-09-14 | Disposition: A | Discharge status: Home / Self Care | Payer: BC Managed Care – PPO | Attending: Cardiovascular Disease | Admitting: Cardiovascular Disease

## 2020-09-14 ENCOUNTER — Other Ambulatory Visit: Payer: Self-pay

## 2020-09-14 ENCOUNTER — Encounter: Payer: Self-pay | Admitting: Cardiovascular Disease

## 2020-09-14 DIAGNOSIS — Z20822 Contact with and (suspected) exposure to covid-19: Secondary | ICD-10-CM | POA: Insufficient documentation

## 2020-09-14 DIAGNOSIS — D6869 Other thrombophilia: Secondary | ICD-10-CM | POA: Diagnosis not present

## 2020-09-14 DIAGNOSIS — I429 Cardiomyopathy, unspecified: Secondary | ICD-10-CM | POA: Insufficient documentation

## 2020-09-14 DIAGNOSIS — Z7901 Long term (current) use of anticoagulants: Secondary | ICD-10-CM | POA: Diagnosis not present

## 2020-09-14 DIAGNOSIS — I499 Cardiac arrhythmia, unspecified: Secondary | ICD-10-CM | POA: Diagnosis not present

## 2020-09-14 DIAGNOSIS — I4819 Other persistent atrial fibrillation: Secondary | ICD-10-CM | POA: Diagnosis not present

## 2020-09-14 DIAGNOSIS — I4891 Unspecified atrial fibrillation: Secondary | ICD-10-CM | POA: Diagnosis not present

## 2020-09-14 DIAGNOSIS — I5022 Chronic systolic (congestive) heart failure: Secondary | ICD-10-CM | POA: Insufficient documentation

## 2020-09-14 DIAGNOSIS — K449 Diaphragmatic hernia without obstruction or gangrene: Secondary | ICD-10-CM | POA: Diagnosis not present

## 2020-09-14 DIAGNOSIS — Z8719 Personal history of other diseases of the digestive system: Secondary | ICD-10-CM | POA: Insufficient documentation

## 2020-09-14 DIAGNOSIS — R000 Tachycardia, unspecified: Secondary | ICD-10-CM | POA: Diagnosis not present

## 2020-09-14 DIAGNOSIS — Z79899 Other long term (current) drug therapy: Secondary | ICD-10-CM | POA: Diagnosis not present

## 2020-09-14 DIAGNOSIS — I4892 Unspecified atrial flutter: Secondary | ICD-10-CM | POA: Diagnosis not present

## 2020-09-14 HISTORY — DX: Unspecified atrial fibrillation: I48.91

## 2020-09-14 HISTORY — PX: CARDIOVERSION: SHX1299

## 2020-09-14 SURGERY — CARDIOVERSION
Anesthesia: General

## 2020-09-14 MED ORDER — PROPOFOL 10 MG/ML IV BOLUS
INTRAVENOUS | Status: AC
  Filled 2020-09-14: qty 20

## 2020-09-14 MED ORDER — PROPOFOL 10 MG/ML IV BOLUS
INTRAVENOUS | Status: DC | PRN
  Administered 2020-09-14 (×2): 20 mg via INTRAVENOUS
  Administered 2020-09-14: 10 mg via INTRAVENOUS
  Administered 2020-09-14 (×2): 20 mg via INTRAVENOUS
  Administered 2020-09-14: 50 mg via INTRAVENOUS

## 2020-09-14 MED ORDER — SODIUM CHLORIDE 0.9 % IV SOLN: INTRAVENOUS | Status: DC | PRN

## 2020-09-14 NOTE — Transfer of Care (Signed)
Immediate Anesthesia Transfer of Care Note  Patient: Joseph [name]  Procedure(s) Performed: CARDIOVERSION (N/A )  Patient Location: PACU  Anesthesia Type:General  Level of Consciousness: drowsy  Airway & Oxygen Therapy: Patient Spontanous Breathing and Patient connected to nasal cannula oxygen  Post-op Assessment: Report given to RN and Post -op Vital signs reviewed and stable  Post vital signs: Reviewed and stable  Last Vitals:  Vitals Value Taken Time  BP 101/73 09/14/20 0745  Temp    Pulse 62 09/14/20 0745  Resp 26 09/14/20 0745  SpO2 93 % 09/14/20 0745    Last Pain:  Vitals:   09/14/20 0717  TempSrc: Oral  PainSc: 0-No pain         Complications: No complications documented.

## 2020-09-14 NOTE — Anesthesia Procedure Notes (Signed)
Date/Time: 09/14/2020 7:30 AM Performed by: Johnna Acosta, CRNA Pre-anesthesia Checklist: Patient identified, Emergency Drugs available, Suction available, Timeout performed and Patient being monitored Patient Re-evaluated:Patient Re-evaluated prior to induction Oxygen Delivery Method: Nasal cannula Preoxygenation: Pre-oxygenation with 100% oxygen Induction Type: IV induction

## 2020-09-14 NOTE — CV Procedure (Signed)
Cardioversion note: A standard informed consent was obtained. Timeout was performed. The pads were placed in the anterior posterior fashion. The patient was given propofol by the anesthesia team.  Successful cardioversion was performed with a 100 J. The patient converted to sinus rhythm. Pre-and post EKGs were reviewed. The patient tolerated the procedure with no immediate complications.  Recommendations: Proceed with sleep apnea evaluation.  The patient is highly suspected of having sleep apnea with significant apneic episodes noted today with propofol. Continue same medications and follow-up with Dr. Quentin Ore as planned.

## 2020-09-14 NOTE — Anesthesia Postprocedure Evaluation (Signed)
Anesthesia Post Note  Patient: Joseph [name]  Procedure(s) Performed: CARDIOVERSION (N/A )  Patient location during evaluation: Specials Recovery Anesthesia Type: General Level of consciousness: awake and alert Pain management: pain level controlled Vital Signs Assessment: post-procedure vital signs reviewed and stable Respiratory status: spontaneous breathing, nonlabored ventilation, respiratory function stable and patient connected to nasal cannula oxygen Cardiovascular status: blood pressure returned to baseline and stable Postop Assessment: no apparent nausea or vomiting Anesthetic complications: no   No complications documented.   Last Vitals:  Vitals:   09/14/20 0800 09/14/20 0815  BP: 97/72 99/83  Pulse: 61 (!) 59  Resp: 10 10  Temp:    SpO2: 97% 97%    Last Pain:  Vitals:   09/14/20 0800  TempSrc:   PainSc: 0-No pain                 Precious Haws Tully Mcinturff

## 2020-09-14 NOTE — H&P (Signed)
Electrophysiology Office Follow up Visit Note:    Date:  08/08/2020   ID:  Joseph [name], DOB 11-17-1960, MRN [id number]  PCP:  Venita Lick, NP             [facility] Cardiologist:  No primary care provider on file.  [facility] Electrophysiologist:  Vickie Epley, MD    Interval History:    Joseph [name] is a 60 y.o. male who presents for a follow up visit.  I last saw the patient in clinic July 04, 2020 for his atrial fibrillation.  He was started on Eliquis the day of our last appointment on July 04, 2020.  I scheduled a cardioversion after our last appointment.  On July 30, 2020 he underwent cardioversion with Dr. Fletcher Anon.  He was shocked 3 times at 200 J.  With each shock he converted to sinus rhythm for a few beats and then went back in atrial fibrillation. He was loaded on amiodarone. Since starting amiodarone he is tolerating the medication well without any off target effects.  Since I last saw the patient he also had an echocardiogram which demonstrated a severely decreased left ventricular function.  He tells me for the last 2 days he has been unable to work because he is felt really poorly.  He has been coughing and coughing up phlegm which sounds like pulmonary edema.  The patient continues to work on weight loss and is now down to 275 pounds.      Past Medical History:  Diagnosis Date  . Hernia, hiatal    20 years ago  . Tachycardia          Past Surgical History:  Procedure Laterality Date  . CARDIOVERSION N/A 07/10/2020   Procedure: CARDIOVERSION;  Surgeon: Nelva Bush, MD;  Location: [facility] ORS;  Service: Cardiovascular;  Laterality: N/A;  . CARDIOVERSION N/A 07/30/2020   Procedure: CARDIOVERSION;  Surgeon: Wellington Hampshire, MD;  Location: [facility] ORS;  Service: Cardiovascular;  Laterality: N/A;  . COLONOSCOPY WITH PROPOFOL N/A 12/02/2019   Procedure: COLONOSCOPY WITH PROPOFOL;  Surgeon: Jonathon Bellows, MD;  Location: [facility] ENDOSCOPY;   Service: Gastroenterology;  Laterality: N/A;  . HIATAL HERNIA REPAIR    . TEE WITHOUT CARDIOVERSION N/A 07/10/2020   Procedure: TEE with Cardioversion;  Surgeon: Nelva Bush, MD;  Location: [facility] ORS;  Service: Cardiovascular;  Laterality: N/A;    Current Medications: Active Medications      Current Meds  Medication Sig  . acetaminophen (TYLENOL) 500 MG tablet Take 1,000 mg by mouth every 6 (six) hours as needed for mild pain or moderate pain.  Marland Kitchen amiodarone (PACERONE) 200 MG tablet Take 200 mg by mouth 2 (two) times daily.  Marland Kitchen apixaban (ELIQUIS) 5 MG TABS tablet Take 1 tablet (5 mg total) by mouth 2 (two) times daily.  Marland Kitchen gabapentin (NEURONTIN) 100 MG capsule Take 100 mg by mouth 3 (three) times daily.  . metoprolol succinate (TOPROL XL) 50 MG 24 hr tablet Take 1 tablet (50 mg total) by mouth daily.  . Naproxen Sodium 220 MG CAPS Take 440 mg by mouth daily as needed (Joint pain).  . rosuvastatin (CRESTOR) 10 MG tablet Take 1 tablet (10 mg total) by mouth daily.  . [DISCONTINUED] amiodarone (PACERONE) 200 MG tablet Take 2 tablets (400 mg) twice a day for 5 days. Then Reduce to 1 tablet (200 mg) twice a day. (Patient taking differently: Take 200 mg by mouth 2 (two) times daily.)       Allergies:  Patient has no known allergies.   Social History        Socioeconomic History  . Marital status: Married    Spouse name: Not on file  . Number of children: Not on file  . Years of education: Not on file  . Highest education level: Not on file  Occupational History    Comment: [employer]  Tobacco Use  . Smoking status: Never Smoker  . Smokeless tobacco: Current User    Types: Chew  Vaping Use  . Vaping Use: Never used  Substance and Sexual Activity  . Alcohol use: Yes  . Drug use: Never  . Sexual activity: Yes  Other Topics Concern  . Not on file  Social History Narrative  . Not on file   Social Determinants of Health   Financial Resource Strain: Not on  file  Food Insecurity: Not on file  Transportation Needs: Not on file  Physical Activity: Not on file  Stress: Not on file  Social Connections: Not on file     Family History: The patient's family history includes COPD in his sister; Leukemia in his father.  ROS:   Please see the history of present illness.    All other systems reviewed and are negative.  EKGs/Labs/Other Studies Reviewed:    The following studies were reviewed today:  July 10, 2020 TEE, personally reviewed Left ventricular function severely decreased, 25%.  Global hypokinesis Moderate mitral regurgitation Small secundum atrial septal defect with left-to-right shunting  EKG:  The ekg ordered today demonstrates atrial fibrillation with a ventricular rate of 103 bpm.  There is an incomplete right bundle branch block.  Recent Labs: 07/02/2020: [name] 32; TSH 3.380 07/19/2020: BUN 9; Creatinine, Ser 0.85; Hemoglobin 16.4; Platelets 159; Potassium 4.3; Sodium 141  Recent Lipid Panel Labs (Brief)          Component Value Date/Time   CHOL 137 07/02/2020 1020   TRIG 91 07/02/2020 1020   HDL 47 07/02/2020 1020   CHOLHDL 3.3 08/12/2019 0837   LDLCALC 73 07/02/2020 1020      Physical Exam:    VS:  BP (!) 148/100 (BP Location: Left Arm, Patient Position: Sitting, Cuff Size: Normal)   Pulse (!) 103   Ht 6' (1.829 m)   Wt 275 lb (124.7 kg)   SpO2 97%   BMI 37.30 kg/m        Wt Readings from Last 3 Encounters:  08/08/20 275 lb (124.7 kg)  07/30/20 275 lb (124.7 kg)  07/19/20 280 lb (127 kg)     GEN:  Well nourished, well developed in no acute distress HEENT: Normal NECK: No JVD; No carotid bruits LYMPHATICS: No lymphadenopathy CARDIAC: Irregularly irregular, no murmurs, rubs, gallops RESPIRATORY:  Clear to auscultation without rales, wheezing or rhonchi  ABDOMEN: Soft, non-tender, non-distended MUSCULOSKELETAL:  No edema; No deformity  SKIN: Warm and dry NEUROLOGIC:  Alert and  oriented x 3 PSYCHIATRIC:  Normal affect   ASSESSMENT:    1. Atrial fibrillation, unspecified type ([place])    PLAN:    In order of problems listed above:  1. Persistent atrial fibrillation Patient with persistent atrial fibrillation which is highly symptomatic and is likely tachycardia mediated cardiomyopathy with the function is severely decreased.*Given that he is complaining of symptoms suggestive of pulmonary edema.  A rhythm control strategy is indicated.  Unfortunately he has failed cardioversion recently.  He was started on amiodarone.  I would recommend that we continue amiodarone for the next few weeks and retry  cardioversion.  Given his young age, amiodarone is not a good long-term strategy.  I would favor continuing amiodarone to get him back in normal rhythm and plan for an atrial fibrillation ablation.  In the interim, he will plan on having his sleep study performed and continuing to lose weight.  He will continue taking Eliquis for stroke prophylaxis.  I will plan to see him about 1 week after his cardioversion to see how he is feeling and to determine whether or not he is maintaining normal rhythm.  2.  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  For now, recommend continuing his metoprolol, amiodarone, Eliquis.  I would like to start him on losartan which we will call in for him.  Follow-up 1 week after cardioversion.   Addendum on 09/14/2020: The patient remains symptomatic with significant tachycardia and shortness of breath.  Precardioversion EKG shows atrial flutter with 2-1 AV block.  By physical exam, heart is mildly tachycardic with no murmurs.  Lungs are clear to auscultation with no significant leg edema.  The patient is here today for elective cardioversion.  He has not missed any dose of Eliquis and has  been loaded with amiodarone.  I answered all his questions and he is ready to proceed.

## 2020-09-14 NOTE — Anesthesia Preprocedure Evaluation (Signed)
Anesthesia Evaluation  Patient identified by MRN, date of birth, ID band Patient awake    Reviewed: Allergy & Precautions, H&P , NPO status , Patient's Chart, lab work & pertinent test results  History of Anesthesia Complications Negative for: history of anesthetic complications  Airway Mallampati: III  TM Distance: >3 FB Neck ROM: full    Dental  (+) Chipped   Pulmonary neg pulmonary ROS, neg shortness of breath,    Pulmonary exam normal        Cardiovascular Exercise Tolerance: Good (-) angina(-) Past MI and (-) DOE + dysrhythmias Atrial Fibrillation  Rhythm:irregular Rate:Tachycardia     Neuro/Psych negative neurological ROS  negative psych ROS   GI/Hepatic negative GI ROS, Neg liver ROS, hiatal hernia, neg GERD  ,  Endo/Other  negative endocrine ROS  Renal/GU negative Renal ROS  negative genitourinary   Musculoskeletal   Abdominal   Peds  Hematology negative hematology ROS (+)   Anesthesia Other Findings Past Medical History: No date: Hernia, hiatal     Comment:  20 years ago No date: Tachycardia  Past Surgical History: 12/02/2019: COLONOSCOPY WITH PROPOFOL; N/A     Comment:  Procedure: COLONOSCOPY WITH PROPOFOL;  Surgeon: Jonathon Bellows, MD;  Location: [facility] ENDOSCOPY;  Service:               Gastroenterology;  Laterality: N/A; No date: HIATAL HERNIA REPAIR  BMI    Body Mass Index: 37.97 kg/m      Reproductive/Obstetrics negative OB ROS                             Anesthesia Physical  Anesthesia Plan  ASA: III  Anesthesia Plan: General   Post-op Pain Management:    Induction: Intravenous  PONV Risk Score and Plan: Propofol infusion and TIVA  Airway Management Planned: Natural Airway and Nasal Cannula  Additional Equipment:   Intra-op Plan:   Post-operative Plan:   Informed Consent: I have reviewed the patients History and Physical, chart, labs  and discussed the procedure including the risks, benefits and alternatives for the proposed anesthesia with the patient or authorized representative who has indicated his/her understanding and acceptance.     Dental Advisory Given  Plan Discussed with: Anesthesiologist, CRNA and Surgeon  Anesthesia Plan Comments: (Patient consented for risks of anesthesia including but not limited to:  - adverse reactions to medications - risk of airway placement if required - damage to eyes, teeth, lips or other oral mucosa - nerve damage due to positioning  - sore throat or hoarseness - Damage to heart, brain, nerves, lungs, other parts of body or loss of life  Patient voiced understanding.)        Anesthesia Quick Evaluation

## 2020-09-14 NOTE — Discharge Instructions (Signed)
Electrical Cardioversion Electrical cardioversion is the delivery of a jolt of electricity to restore a normal rhythm to the heart. A rhythm that is too fast or is not regular keeps the heart from pumping well. In this procedure, sticky patches or metal paddles are placed on the chest to deliver electricity to the heart from a device. This procedure may be done in an emergency if:  There is low or no blood pressure as a result of the heart rhythm.  Normal rhythm must be restored as fast as possible to protect the brain and heart from further damage.  It may save a life. This may also be a scheduled procedure for irregular or fast heart rhythms that are not immediately life-threatening. Tell a health care provider about:  Any allergies you have.  All medicines you are taking, including vitamins, herbs, eye drops, creams, and over-the-counter medicines.  Any problems you or family members have had with anesthetic medicines.  Any blood disorders you have.  Any surgeries you have had.  Any medical conditions you have.  Whether you are pregnant or may be pregnant. What are the risks? Generally, this is a safe procedure. However, problems may occur, including:  Allergic reactions to medicines.  A blood clot that breaks free and travels to other parts of your body.  The possible return of an abnormal heart rhythm within hours or days after the procedure.  Your heart stopping (cardiac arrest). This is rare. What happens before the procedure? Medicines  Your health care provider may have you start taking: ? Blood-thinning medicines (anticoagulants) so your blood does not clot as easily. ? Medicines to help stabilize your heart rate and rhythm.  Ask your health care provider about: ? Changing or stopping your regular medicines. This is especially important if you are taking diabetes medicines or blood thinners. ? Taking medicines such as aspirin and ibuprofen. These medicines can  thin your blood. Do not take these medicines unless your health care provider tells you to take them. ? Taking over-the-counter medicines, vitamins, herbs, and supplements. General instructions  Follow instructions from your health care provider about eating or drinking restrictions.  Plan to have someone take you home from the hospital or clinic.  If you will be going home right after the procedure, plan to have someone with you for 24 hours.  Ask your health care provider what steps will be taken to help prevent infection. These may include washing your skin with a germ-killing soap. What happens during the procedure?  An IV will be inserted into one of your veins.  Sticky patches (electrodes) or metal paddles may be placed on your chest.  You will be given a medicine to help you relax (sedative).  An electrical shock will be delivered. The procedure may vary among health care providers and hospitals.   What can I expect after the procedure?  Your blood pressure, heart rate, breathing rate, and blood oxygen level will be monitored until you leave the hospital or clinic.  Your heart rhythm will be watched to make sure it does not change.  You may have some redness on the skin where the shocks were given. Follow these instructions at home:  Do not drive for 24 hours if you were given a sedative during your procedure.  Take over-the-counter and prescription medicines only as told by your health care provider.  Ask your health care provider how to check your pulse. Check it often.  Rest for 48 hours after the procedure   or as told by your health care provider.  Avoid or limit your caffeine use as told by your health care provider.  Keep all follow-up visits as told by your health care provider. This is important. Contact a health care provider if:  You feel like your heart is beating too quickly or your pulse is not regular.  You have a serious muscle cramp that does not go  away. Get help right away if:  You have discomfort in your chest.  You are dizzy or you feel faint.  You have trouble breathing or you are short of breath.  Your speech is slurred.  You have trouble moving an arm or leg on one side of your body.  Your fingers or toes turn cold or blue. Summary  Electrical cardioversion is the delivery of a jolt of electricity to restore a normal rhythm to the heart.  This procedure may be done right away in an emergency or may be a scheduled procedure if the condition is not an emergency.  Generally, this is a safe procedure.  After the procedure, check your pulse often as told by your health care provider. This information is not intended to replace advice given to you by your health care provider. Make sure you discuss any questions you have with your health care provider. Document Revised: 12/27/2018 Document Reviewed: 12/27/2018 Elsevier Patient Education  2021 Elsevier Inc. Moderate Conscious Sedation, Adult, Care After This sheet gives you information about how to care for yourself after your procedure. Your health care provider may also give you more specific instructions. If you have problems or questions, contact your health care provider. What can I expect after the procedure? After the procedure, it is common to have:  Sleepiness for several hours.  Impaired judgment for several hours.  Difficulty with balance.  Vomiting if you eat too soon. Follow these instructions at home: For the time period you were told by your health care provider:  Rest.  Do not participate in activities where you could fall or become injured.  Do not drive or use machinery.  Do not drink alcohol.  Do not take sleeping pills or medicines that cause drowsiness.  Do not make important decisions or sign legal documents.  Do not take care of children on your own.      Eating and drinking  Follow the diet recommended by your health care  provider.  Drink enough fluid to keep your urine pale yellow.  If you vomit: ? Drink water, juice, or soup when you can drink without vomiting. ? Make sure you have little or no nausea before eating solid foods.   General instructions  Take over-the-counter and prescription medicines only as told by your health care provider.  Have a responsible adult stay with you for the time you are told. It is important to have someone help care for you until you are awake and alert.  Do not smoke.  Keep all follow-up visits as told by your health care provider. This is important. Contact a health care provider if:  You are still sleepy or having trouble with balance after 24 hours.  You feel light-headed.  You keep feeling nauseous or you keep vomiting.  You develop a rash.  You have a fever.  You have redness or swelling around the IV site. Get help right away if:  You have trouble breathing.  You have new-onset confusion at home. Summary  After the procedure, it is common to feel sleepy, have   impaired judgment, or feel nauseous if you eat too soon.  Rest after you get home. Know the things you should not do after the procedure.  Follow the diet recommended by your health care provider and drink enough fluid to keep your urine pale yellow.  Get help right away if you have trouble breathing or new-onset confusion at home. This information is not intended to replace advice given to you by your health care provider. Make sure you discuss any questions you have with your health care provider. Document Revised: 09/23/2019 Document Reviewed: 04/21/2019 Elsevier Patient Education  2021 Elsevier Inc.  

## 2020-09-14 NOTE — Addendum Note (Signed)
Addendum  created 09/14/20 0831 by Lesle Reek, CRNA   Flowsheet accepted, Intraprocedure Flowsheets edited

## 2020-09-17 ENCOUNTER — Encounter: Payer: Self-pay | Admitting: Cardiovascular Disease

## 2020-09-21 ENCOUNTER — Other Ambulatory Visit: Payer: Self-pay

## 2020-09-21 ENCOUNTER — Ambulatory Visit (INDEPENDENT_AMBULATORY_CARE_PROVIDER_SITE_OTHER): Payer: BC Managed Care – PPO

## 2020-09-21 DIAGNOSIS — I4891 Unspecified atrial fibrillation: Secondary | ICD-10-CM | POA: Diagnosis not present

## 2020-09-21 LAB — ECHOCARDIOGRAM COMPLETE
AR max vel: 4.36 cm2
AV Area VTI: 4.23 cm2
AV Area mean vel: 4.13 cm2
AV Mean grad: 2.3 mmHg
AV Peak grad: 3.7 mmHg
Ao pk vel: 0.96 m/s
S' Lateral: 5.1 cm

## 2020-09-21 MED ORDER — PERFLUTREN LIPID MICROSPHERE
1.0000 mL | INTRAVENOUS | Status: AC | PRN
  Administered 2020-09-21: 2 mL via INTRAVENOUS

## 2020-09-26 ENCOUNTER — Encounter: Payer: Self-pay | Admitting: Cardiology

## 2020-09-26 ENCOUNTER — Ambulatory Visit: Payer: BC Managed Care – PPO | Admitting: Cardiology

## 2020-09-26 ENCOUNTER — Other Ambulatory Visit: Payer: Self-pay

## 2020-09-26 ENCOUNTER — Encounter: Payer: Self-pay | Admitting: Nurse Practitioner

## 2020-09-26 VITALS — BP 120/80 | HR 71 | Ht 72.0 in | Wt 272.0 lb

## 2020-09-26 DIAGNOSIS — E669 Obesity, unspecified: Secondary | ICD-10-CM

## 2020-09-26 DIAGNOSIS — R000 Tachycardia, unspecified: Secondary | ICD-10-CM

## 2020-09-26 DIAGNOSIS — I5022 Chronic systolic (congestive) heart failure: Secondary | ICD-10-CM | POA: Diagnosis not present

## 2020-09-26 DIAGNOSIS — I712 Thoracic aortic aneurysm, without rupture: Secondary | ICD-10-CM

## 2020-09-26 DIAGNOSIS — I77819 Aortic ectasia, unspecified site: Secondary | ICD-10-CM | POA: Insufficient documentation

## 2020-09-26 DIAGNOSIS — I43 Cardiomyopathy in diseases classified elsewhere: Secondary | ICD-10-CM

## 2020-09-26 DIAGNOSIS — I7121 Aneurysm of the ascending aorta, without rupture: Secondary | ICD-10-CM

## 2020-09-26 DIAGNOSIS — I4819 Other persistent atrial fibrillation: Secondary | ICD-10-CM

## 2020-09-26 DIAGNOSIS — I428 Other cardiomyopathies: Secondary | ICD-10-CM

## 2020-09-26 MED ORDER — AMIODARONE HCL 200 MG PO TABS: 200.0000 mg | ORAL_TABLET | Freq: Every day | ORAL | 3 refills | Status: DC

## 2020-09-26 NOTE — Progress Notes (Addendum)
Electrophysiology Office Follow up Visit Note:    Date:  09/26/2020   ID:  Joseph [name], DOB August 14, 1960, MRN [id number]  PCP:  Joseph Lick, NP  [facility] Cardiologist:  No primary care provider on file.  [facility] Electrophysiologist:  Joseph Epley, MD    Interval History:    Joseph [name] is a 60 y.o. male who presents for a follow up visit. They were last seen in clinic August 08, 2020.  At that appointment we scheduled a cardioversion which has since been performed on April 8.  He has maintained normal rhythm since the cardioversion.  He feels well today.  Breathing is better.  No symptoms of heart failure.  No syncope or presyncope.  He has a sleep study scheduled for May in a sleep lab in [place].    Past Medical History:  Diagnosis Date  . Atrial fibrillation ([place])   . Hernia, hiatal    20 years ago  . Tachycardia     Past Surgical History:  Procedure Laterality Date  . CARDIOVERSION N/A 07/10/2020   Procedure: CARDIOVERSION;  Surgeon: Joseph Bush, MD;  Location: [facility] ORS;  Service: Cardiovascular;  Laterality: N/A;  . CARDIOVERSION N/A 07/30/2020   Procedure: CARDIOVERSION;  Surgeon: Joseph Hampshire, MD;  Location: [facility];  Service: Cardiovascular;  Laterality: N/A;  . CARDIOVERSION N/A 09/14/2020   Procedure: CARDIOVERSION;  Surgeon: Joseph Hampshire, MD;  Location: [facility] ORS;  Service: Cardiovascular;  Laterality: N/A;  . COLONOSCOPY WITH PROPOFOL N/A 12/02/2019   Procedure: COLONOSCOPY WITH PROPOFOL;  Surgeon: Joseph Bellows, MD;  Location: [facility] ENDOSCOPY;  Service: Gastroenterology;  Laterality: N/A;  . HIATAL HERNIA REPAIR    . TEE WITHOUT CARDIOVERSION N/A 07/10/2020   Procedure: TEE with Cardioversion;  Surgeon: Joseph Bush, MD;  Location: [facility] ORS;  Service: Cardiovascular;  Laterality: N/A;    Current Medications: Current Meds  Medication Sig  . acetaminophen (TYLENOL) 500 MG tablet Take 1,000 mg by mouth every 6 (six) hours as needed  for mild pain or moderate pain.  Marland Kitchen amiodarone (PACERONE) 200 MG tablet Take 1 tablet (200 mg total) by mouth daily.  Marland Kitchen apixaban (ELIQUIS) 5 MG TABS tablet Take 1 tablet (5 mg total) by mouth 2 (two) times daily.  Marland Kitchen gabapentin (NEURONTIN) 100 MG capsule Take 100 mg by mouth 3 (three) times daily.  Marland Kitchen loratadine (CLARITIN) 10 MG tablet Take 10 mg by mouth daily.  Marland Kitchen losartan (COZAAR) 25 MG tablet Take 1 tablet (25 mg total) by mouth daily.  . metoprolol succinate (TOPROL-XL) 50 MG 24 hr tablet TAKE 1 TABLET BY MOUTH EVERY DAY  . rosuvastatin (CRESTOR) 10 MG tablet Take 1 tablet (10 mg total) by mouth daily.  . [DISCONTINUED] amiodarone (PACERONE) 200 MG tablet Take 1 tablet (200 mg total) by mouth 2 (two) times daily.     Allergies:   Patient has no known allergies.   Social History   Socioeconomic History  . Marital status: Married    Spouse name: Not on file  . Number of children: Not on file  . Years of education: Not on file  . Highest education level: Not on file  Occupational History    Comment: [employer]  Tobacco Use  . Smoking status: Never Smoker  . Smokeless tobacco: Current User    Types: Chew  Vaping Use  . Vaping Use: Never used  Substance and Sexual Activity  . Alcohol use: Yes  . Drug use: Never  . Sexual activity: Yes  Other Topics Concern  . Not on file  Social History Narrative  . Not on file   Social Determinants of Health   Financial Resource Strain: Not on file  Food Insecurity: Not on file  Transportation Needs: Not on file  Physical Activity: Not on file  Stress: Not on file  Social Connections: Not on file     Family History: The patient's family history includes COPD in his sister; Leukemia in his father.  ROS:   Please see the history of present illness.    All other systems reviewed and are negative.  EKGs/Labs/Other Studies Reviewed:    The following studies were reviewed today:  September 21, 2020 echocardiogram personally  reviewed Left ventricular function moderately decreased, 30 to 35% Global hypokinesis Mildly dilated left atrium  EKG:  The ekg ordered today demonstrates sinus rhythm with a left anterior fascicular block and a first-degree AV delay  Recent Labs: 07/02/2020: [name] 32; TSH 3.380 07/19/2020: BUN 9; Creatinine, Ser 0.85; Hemoglobin 16.4; Platelets 159; Potassium 4.3; Sodium 141  Recent Lipid Panel    Component Value Date/Time   CHOL 137 07/02/2020 1020   TRIG 91 07/02/2020 1020   HDL 47 07/02/2020 1020   CHOLHDL 3.3 08/12/2019 0837   LDLCALC 73 07/02/2020 1020    Physical Exam:    VS:  BP 120/80   Pulse 71   Ht 6' (1.829 m)   Wt 272 lb (123.4 kg)   SpO2 96%   BMI 36.89 kg/m     Wt Readings from Last 3 Encounters:  09/26/20 272 lb (123.4 kg)  09/14/20 272 lb (123.4 kg)  08/08/20 275 lb (124.7 kg)     GEN:  Well nourished, well developed in no acute distress.  Obese HEENT: Normal NECK: No JVD; No carotid bruits LYMPHATICS: No lymphadenopathy CARDIAC: RRR, no murmurs, rubs, gallops RESPIRATORY:  Clear to auscultation without rales, wheezing or rhonchi  ABDOMEN: Soft, non-tender, non-distended MUSCULOSKELETAL:  No edema; No deformity  SKIN: Warm and dry NEUROLOGIC:  Alert and oriented x 3 PSYCHIATRIC:  Normal affect   ASSESSMENT:    1. Persistent atrial fibrillation ([place])   2. Chronic systolic heart failure ([place])   3. 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 Obesity (BMI 30-39.9)   5. Ascending aortic aneurysm ([facility])    PLAN:    In order of problems listed above:  1. Persistent atrial fibrillation On Eliquis for stroke prophylaxis.  On metoprolol succinate 50 mg once daily and amiodarone 200 mg by mouth twice daily. We discussed the ablation procedure in detail during today's visit including the expected efficacy in the 65 to 75% range, risks, and recovery time.  He is interested in pursuing this in an effort to get off of the amiodarone given his young age and  associated risks.  He will plan to reach out to [place] after his sleep study to get this scheduled. In the meantime, I would like him to reduce his amiodarone dose to 200 mg by mouth once daily.  2.  Chronic systolic heart failure NYHA class II symptoms.  Warm and dry on exam.  EF 30% on most recent echocardiogram.  Likely tachycardia induced.  Continue current medications.  We will plan to repeat the echocardiogram after several months of being in normal rhythm.  3.  Dilated ascending aorta 4.7 cm on echo in April 2022. We will plan to image his chest at the next appointment for both his aorta and pulmonary veins with a gated CT.  This will give [place] some interval measurements to see if there is any change in the aortic dimensions.   3.  Obesity. Counseled on weight loss.  Patient has sleep study scheduled in May.  Follow-up 3 months with blood work at that time.   Medication Adjustments/Labs and Tests Ordered: Current medicines are reviewed at length with the patient today.  Concerns regarding medicines are outlined above.  Orders Placed This Encounter  Procedures  . EKG 12-Lead   Meds ordered this encounter  Medications  . amiodarone (PACERONE) 200 MG tablet    Sig: Take 1 tablet (200 mg total) by mouth daily.    Dispense:  90 tablet    Refill:  3    Dose reducation.  Pt may not need a refill yet.     Signed, Lars Mage, MD, [facility], [facility] 09/26/2020 10:06 AM    Electrophysiology [facility]

## 2020-09-26 NOTE — Patient Instructions (Addendum)
Medication Instructions:  Your physician has recommended you make the following change in your medication:   1.  REDUCE your amiodarone 200 mg-  Take one tablet by mouth DAILY  *If you need a refill on your cardiac medications before your next appointment, please call your pharmacy*  Lab Work: None ordered. If you have labs (blood work) drawn today and your tests are completely normal, you will receive your results only by: Marland Kitchen MyChart Message (if you have MyChart) OR . A paper copy in the mail If you have any lab test that is abnormal or we need to change your treatment, we will call you to review the results.  Testing/Procedures: None ordered.  Follow-Up: At [facility], you and your health needs are our priority.  As part of our continuing mission to provide you with exceptional heart care, we have created designated Provider Care Teams.  These Care Teams include your primary Cardiologist (physician) and Advanced Practice Providers (APPs -  Physician Assistants and Nurse Practitioners) who all work together to provide you with the care you need, when you need it.  Your next appointment:    Contact me after your sleep study to discuss scheduling an afib ablation.  Sonia Baller RN

## 2020-10-28 ENCOUNTER — Other Ambulatory Visit: Payer: Self-pay | Admitting: Nurse Practitioner

## 2020-10-28 NOTE — Telephone Encounter (Signed)
Requested Prescriptions  Pending Prescriptions Disp Refills  . ELIQUIS 5 MG TABS tablet [Pharmacy Med Name: ELIQUIS 5 MG TABLET] 180 tablet 1    Sig: TAKE 1 TABLET BY MOUTH TWICE A DAY     Hematology:  Anticoagulants Passed - 10/28/2020  9:36 AM      Passed - HGB in normal range and within 360 days    Hemoglobin  Date Value Ref Range Status  07/19/2020 16.4 13.0 - 17.7 g/dL Final         Passed - PLT in normal range and within 360 days    Platelets  Date Value Ref Range Status  07/19/2020 159 150 - 450 x10E3/uL Final         Passed - HCT in normal range and within 360 days    Hematocrit  Date Value Ref Range Status  07/19/2020 47.6 37.5 - 51.0 % Final         Passed - Cr in normal range and within 360 days    Creatinine, Ser  Date Value Ref Range Status  07/19/2020 0.85 0.76 - 1.27 mg/dL Final         Passed - Valid encounter within last 12 months    Recent Outpatient Visits          3 months ago [facility] [place], Henrine Screws T, NP   1 year ago Need for Tdap vaccination   [place] [facility] [place], Barbaraann Faster, NP   1 year ago Chronic pain of both knees   [facility], [place] T, NP   1 year ago Encounter to establish care   [facility], Barbaraann Faster, NP

## 2020-10-30 ENCOUNTER — Encounter: Payer: Self-pay | Admitting: Neurology

## 2020-10-30 ENCOUNTER — Ambulatory Visit: Payer: BC Managed Care – PPO | Admitting: Neurology

## 2020-10-30 VITALS — BP 151/98 | HR 82 | Ht 72.0 in | Wt 276.0 lb

## 2020-10-30 DIAGNOSIS — Z9189 Other specified personal risk factors, not elsewhere classified: Secondary | ICD-10-CM

## 2020-10-30 DIAGNOSIS — I480 Paroxysmal atrial fibrillation: Secondary | ICD-10-CM | POA: Diagnosis not present

## 2020-10-30 NOTE — Patient Instructions (Signed)
Atrial Fibrillation  Atrial fibrillation is a type of irregular or rapid heartbeat (arrhythmia). In atrial fibrillation, the top part of the heart (atria) beats in an irregular pattern. This makes the heart unable to pump blood normally and effectively. The goal of treatment is to prevent blood clots from forming, control your heart rate, or restore your heartbeat to a normal rhythm. If this condition is not treated, it can cause serious problems, such as a weakened heart muscle (cardiomyopathy) or a stroke. What are the causes? This condition is often caused by medical conditions that damage the heart's electrical system. These include:  High blood pressure (hypertension). This is the most common cause.  Certain heart problems or conditions, such as heart failure, coronary artery disease, heart valve problems, or heart surgery.  Diabetes.  Overactive thyroid (hyperthyroidism).  Obesity.  Chronic kidney disease. In some cases, the cause of this condition is not known. What increases the risk? This condition is more likely to develop in:  Older people.  People who smoke.  Athletes who do endurance exercise.  People who have a family history of atrial fibrillation.  Men.  People who use drugs.  People who drink a lot of alcohol.  People who have lung conditions, such as emphysema, pneumonia, or COPD.  People who have obstructive sleep apnea. What are the signs or symptoms? Symptoms of this condition include:  A feeling that your heart is racing or beating irregularly.  Discomfort or pain in your chest.  Shortness of breath.  Sudden light-headedness or weakness.  Tiring easily during exercise or activity.  Fatigue.  Syncope (fainting).  Sweating. In some cases, there are no symptoms. How is this diagnosed? Your health care provider may detect atrial fibrillation when taking your pulse. If detected, this condition may be diagnosed with:  An electrocardiogram  (ECG) to check electrical signals of the heart.  An ambulatory cardiac monitor to record your heart's activity for a few days.  A transthoracic echocardiogram (TTE) to create pictures of your heart.  A transesophageal echocardiogram (TEE) to create even closer pictures of your heart.  A stress test to check your blood supply while you exercise.  Imaging tests, such as a CT scan or chest X-ray.  Blood tests. How is this treated? Treatment depends on underlying conditions and how you feel when you experience atrial fibrillation. This condition may be treated with:  Medicines to prevent blood clots or to treat heart rate or heart rhythm problems.  Electrical cardioversion to reset the heart's rhythm.  A pacemaker to correct abnormal heart rhythm.  Ablation to remove the heart tissue that sends abnormal signals.  Left atrial appendage closure to seal the area where blood clots can form. In some cases, underlying conditions will be treated. Follow these instructions at home: Medicines  Take over-the counter and prescription medicines only as told by your health care provider.  Do not take any new medicines without talking to your health care provider.  If you are taking blood thinners: ? Talk with your health care provider before you take any medicines that contain aspirin or NSAIDs, such as ibuprofen. These medicines increase your risk for dangerous bleeding. ? Take your medicine exactly as told, at the same time every day. ? Avoid activities that could cause injury or bruising, and follow instructions about how to prevent falls. ? Wear a medical alert bracelet or carry a card that lists what medicines you take. Lifestyle  Do not use any products that contain nicotine or  tobacco, such as cigarettes, e-cigarettes, and chewing tobacco. If you need help quitting, ask your health care provider.  Eat heart-healthy foods. Talk with a dietitian to make an eating plan that is right for  you.  Exercise regularly as told by your health care provider.  Do not drink alcohol.  Lose weight if you are overweight.  Do not use drugs, including cannabis.      General instructions  If you have obstructive sleep apnea, manage your condition as told by your health care provider.  Do not use diet pills unless your health care provider approves. Diet pills can make heart problems worse.  Keep all follow-up visits as told by your health care provider. This is important. Contact a health care provider if you:  Notice a change in the rate, rhythm, or strength of your heartbeat.  Are taking a blood thinner and you notice more bruising.  Tire more easily when you exercise or do heavy work.  Have a sudden change in weight. Get help right away if you have:  Chest pain, abdominal pain, sweating, or weakness.  Trouble breathing.  Side effects of blood thinners, such as blood in your vomit, stool, or urine, or bleeding that cannot stop.  Any symptoms of a stroke. "BE FAST" is an easy way to remember the main warning signs of a stroke: ? B - Balance. Signs are dizziness, sudden trouble walking, or loss of balance. ? E - Eyes. Signs are trouble seeing or a sudden change in vision. ? F - Face. Signs are sudden weakness or numbness of the face, or the face or eyelid drooping on one side. ? A - Arms. Signs are weakness or numbness in an arm. This happens suddenly and usually on one side of the body. ? S - Speech. Signs are sudden trouble speaking, slurred speech, or trouble understanding what people say. ? T - Time. Time to call emergency services. Write down what time symptoms started.  Other signs of a stroke, such as: ? A sudden, severe headache with no known cause. ? Nausea or vomiting. ? Seizure. These symptoms may represent a serious problem that is an emergency. Do not wait to see if the symptoms will go away. Get medical help right away. Call your local emergency services  (911 in the [place] Do not drive yourself to the hospital.   Summary  Atrial fibrillation is a type of irregular or rapid heartbeat (arrhythmia).  Symptoms include a feeling that your heart is beating fast or irregularly.  You may be given medicines to prevent blood clots or to treat heart rate or heart rhythm problems.  Get help right away if you have signs or symptoms of a stroke.  Get help right away if you cannot catch your breath or have chest pain or pressure. This information is not intended to replace advice given to you by your health care provider. Make sure you discuss any questions you have with your health care provider. Document Revised: 11/17/2018 Document Reviewed: 11/17/2018 Elsevier Patient Education  [place]. Sleep Apnea Sleep apnea affects breathing during sleep. It causes breathing to stop for a short time or to become shallow. It can also increase the risk of:  Heart attack.  Stroke.  Being very overweight (obese).  Diabetes.  Heart failure.  Irregular heartbeat. The goal of treatment is to help you breathe normally again. What are the causes? There are three kinds of sleep apnea:  Obstructive sleep apnea. This is caused by  a blocked or collapsed airway.  Central sleep apnea. This happens when the brain does not send the right signals to the muscles that control breathing.  Mixed sleep apnea. This is a combination of obstructive and central sleep apnea. The most common cause of this condition is a collapsed or blocked airway. This can happen if:  Your throat muscles are too relaxed.  Your tongue and tonsils are too large.  You are overweight.  Your airway is too small.   What increases the risk?  Being overweight.  Smoking.  Having a small airway.  Being older.  Being male.  Drinking alcohol.  Taking medicines to calm yourself (sedatives or tranquilizers).  Having family members with the condition. What are the signs or  symptoms?  Trouble staying asleep.  Being sleepy or tired during the day.  Getting angry a lot.  Loud snoring.  Headaches in the morning.  Not being able to focus your mind (concentrate).  Forgetting things.  Less interest in sex.  Mood swings.  Personality changes.  Feelings of sadness (depression).  Waking up a lot during the night to pee (urinate).  Dry mouth.  Sore throat. How is this diagnosed?  Your medical history.  A physical exam.  A test that is done when you are sleeping (sleep study). The test is most often done in a sleep lab but may also be done at home. How is this treated?  Sleeping on your side.  Using a medicine to get rid of mucus in your nose (decongestant).  Avoiding the use of alcohol, medicines to help you relax, or certain pain medicines (narcotics).  Losing weight, if needed.  Changing your diet.  Not smoking.  Using a machine to open your airway while you sleep, such as: ? An oral appliance. This is a mouthpiece that shifts your lower jaw forward. ? A CPAP device. This device blows air through a mask when you breathe out (exhale). ? An EPAP device. This has valves that you put in each nostril. ? A BPAP device. This device blows air through a mask when you breathe in (inhale) and breathe out.  Having surgery if other treatments do not work. It is important to get treatment for sleep apnea. Without treatment, it can lead to:  High blood pressure.  Coronary artery disease.  In men, not being able to have an erection (impotence).  Reduced thinking ability.   Follow these instructions at home: Lifestyle  Make changes that your doctor recommends.  Eat a healthy diet.  Lose weight if needed.  Avoid alcohol, medicines to help you relax, and some pain medicines.  Do not use any products that contain nicotine or tobacco, such as cigarettes, e-cigarettes, and chewing tobacco. If you need help quitting, ask your  doctor. General instructions  Take over-the-counter and prescription medicines only as told by your doctor.  If you were given a machine to use while you sleep, use it only as told by your doctor.  If you are having surgery, make sure to tell your doctor you have sleep apnea. You may need to bring your device with you.  Keep all follow-up visits as told by your doctor. This is important. Contact a doctor if:  The machine that you were given to use during sleep bothers you or does not seem to be working.  You do not get better.  You get worse. Get help right away if:  Your chest hurts.  You have trouble breathing in enough air.  You have an uncomfortable feeling in your back, arms, or stomach.  You have trouble talking.  One side of your body feels weak.  A part of your face is hanging down. These symptoms may be an emergency. Do not wait to see if the symptoms will go away. Get medical help right away. Call your local emergency services (911 in the [place] Do not drive yourself to the hospital. Summary  This condition affects breathing during sleep.  The most common cause is a collapsed or blocked airway.  The goal of treatment is to help you breathe normally while you sleep. This information is not intended to replace advice given to you by your health care provider. Make sure you discuss any questions you have with your health care provider. Document Revised: 03/12/2018 Document Reviewed: 01/19/2018 Elsevier Patient Education  [place].

## 2020-10-30 NOTE — Progress Notes (Signed)
SLEEP MEDICINE CLINIC    Provider:  Larey Seat, MD  Primary Care Physician:  Venita Lick, NP [place] [address]     Referring Provider: Venita Lick, [address],  [place] [address]          Chief Complaint according to patient   Patient presents with:    . New Patient (Initial Visit)     Internal referral for atrial fibrillation, evident in a surgical procedure for knee- he was told he has likely sleep apnea. Cardioversion Times 3.  He stopped breathing just being given a mild anesthetic. -  Wife chose different bedroom because of snoring.  No prior sleep study or CPAP.      HISTORY OF PRESENT ILLNESS:  Joseph [name] is a 60 y.o. year old White or Caucasian male patient seen here as a referral on 10/30/2020 from [facility] provider.  Cardiologist is Dr. Quentin Ore , he is anticoagulated and prepared for an ablation.  Chief concern according to patient :  see above . Has lost 40 pounds since January events of atrial fib being identified.     Joseph [name] is a right -handed White or Caucasian male with a witnessed  sleep disorder.  who  has a past medical history of Atrial fibrillation ([place]), Hernia, hiatal, knee arthritis, Obesity and RVR-Tachycardia.   Sleep relevant medical history: No Nocturia, childhood Sleep walking, has not had a Tonsillectomy.   Family medical /sleep history: No other family member on CPAP with OSA.   Social history:  Patient is working as Sport and exercise psychologist, Sales executive of soccer equipment and lives in a household with spouse, adult daughter lives on her own.   The patient currently works daytime, sedate. 2 cats are present. Tobacco use: chewing daily .  ETOH use ; beer 1-2 6 packs a weekend,   Caffeine intake in form of Coffee( 1 cup in AM ) Soda( 2 cokes at lunch). Regular exercise in form of soccer , football, baseball.       Sleep habits are as follows: The patient's dinner time is between 6.30 PM.  The patient goes to bed at 10 PM and continues to sleep for 4-5 hours, wakes sometimes because of TV in the background- rarely for bathroom breaks, the first time at 3-4 AM.   The preferred sleep position is on the side- , with the support of 2 pillows and a knee pillow .  Dreams are reportedly frequent. 6.20 AM is the usual rise time. The patient wakes up with an alarm.  He reports not feeling refreshed or restored in AM, with symptoms such as dry mouth , soemtimes morning headaches , and residual fatigue.  Naps are taken on weekends frequently, lasting from 15-30 minutes and are  refreshing .    Review of Systems: Out of a complete 14 system review, the patient complains of only the following symptoms, and all other reviewed systems are negative.:  Fatigue, sleepiness , snoring, fragmented sleep, knee pain  atrial fib. Palpitations.   How likely are you to doze in the following situations: 0 = not likely, 1 = slight chance, 2 = moderate chance, 3 = high chance   Sitting and Reading? Watching Television? Sitting inactive in a public place (theater or meeting)? As a passenger in a car for an hour without a break? Lying down in the afternoon when circumstances permit? Sitting and talking to someone? Sitting quietly after lunch  without alcohol? In a car, while stopped for a few minutes in traffic?   Total = 6/ 24 points   FSS endorsed at 0/ 63 points.   Social History   Socioeconomic History  . Marital status: Married    Spouse name: Not on file  . Number of children: Not on file  . Years of education: Not on file  . Highest education level: Not on file  Occupational History    Comment: [employer]  Tobacco Use  . Smoking status: Never Smoker  . Smokeless tobacco: Current User    Types: Chew  Vaping Use  . Vaping Use: Never used  Substance and Sexual Activity  . Alcohol use: Yes    Comment: 12 pack on the weekend  . Drug use: Never  . Sexual activity: Yes  Other  Topics Concern  . Not on file  Social History Narrative   Caffeine use: 2 diet cokes at lunch, coffee sometimes in the morning   Right handed   Social Determinants of Health   Financial Resource Strain: Not on file  Food Insecurity: Not on file  Transportation Needs: Not on file  Physical Activity: Not on file  Stress: Not on file  Social Connections: Not on file    Family History  Problem Relation Age of Onset  . Leukemia Father   . COPD Sister     Past Medical History:  Diagnosis Date  . Atrial fibrillation ([place])   . Hernia, hiatal    20 years ago  . Tachycardia     Past Surgical History:  Procedure Laterality Date  . CARDIOVERSION N/A 07/10/2020   Procedure: CARDIOVERSION;  Surgeon: Nelva Bush, MD;  Location: [facility] ORS;  Service: Cardiovascular;  Laterality: N/A;  . CARDIOVERSION N/A 07/30/2020   Procedure: CARDIOVERSION;  Surgeon: Wellington Hampshire, MD;  Location: [facility];  Service: Cardiovascular;  Laterality: N/A;  . CARDIOVERSION N/A 09/14/2020   Procedure: CARDIOVERSION;  Surgeon: Wellington Hampshire, MD;  Location: [facility] ORS;  Service: Cardiovascular;  Laterality: N/A;  . COLONOSCOPY WITH PROPOFOL N/A 12/02/2019   Procedure: COLONOSCOPY WITH PROPOFOL;  Surgeon: Jonathon Bellows, MD;  Location: [facility] END[facility]COPY;  Service: Gastroenterology;  Laterality: N/A;  . HIATAL HERNIA REPAIR    . TEE WITHOUT CARDIOVERSION N/A 07/10/2020   Procedure: TEE with Cardioversion;  Surgeon: Nelva Bush, MD;  Location: [facility] ORS;  Service: Cardiovascular;  Laterality: N/A;     Current Outpatient Medications on File Prior to Visit  Medication Sig Dispense Refill  . acetaminophen (TYLENOL) 500 MG tablet Take 1,000 mg by mouth every 6 (six) hours as needed for mild pain or moderate pain.    Marland Kitchen amiodarone (PACERONE) 200 MG tablet Take 1 tablet (200 mg total) by mouth daily. 90 tablet 3  . ELIQUIS 5 MG TABS tablet TAKE 1 TABLET BY MOUTH TWICE A DAY 180 tablet 1  . gabapentin (NEURONTIN) 100  MG capsule Take 100 mg by mouth 3 (three) times daily.    Marland Kitchen loratadine (CLARITIN) 10 MG tablet Take 10 mg by mouth daily.    Marland Kitchen losartan (COZAAR) 25 MG tablet Take 1 tablet (25 mg total) by mouth daily. 90 tablet 3  . metoprolol succinate (TOPROL-XL) 50 MG 24 hr tablet TAKE 1 TABLET BY MOUTH EVERY DAY 30 tablet 3  . naproxen sodium (ALEVE) 220 MG tablet Take 220 mg by mouth as needed.    . rosuvastatin (CRESTOR) 10 MG tablet Take 1 tablet (10 mg total) by mouth daily. 90 tablet  3   No current facility-administered medications on file prior to visit.    No Known Allergies  Physical exam:  Today's Vitals   10/30/20 0842  BP: (!) 151/98  Pulse: 82  SpO2: 97%  Weight: 276 lb (125.2 kg)  Height: 6' (1.829 m)   Body mass index is 37.43 kg/m.   Wt Readings from Last 3 Encounters:  10/30/20 276 lb (125.2 kg)  09/26/20 272 lb (123.4 kg)  09/14/20 272 lb (123.4 kg)     Ht Readings from Last 3 Encounters:  10/30/20 6' (1.829 m)  09/26/20 6' (1.829 m)  09/14/20 6' (1.829 m)      General: The patient is awake, alert and appears not in acute distress. The patient is well groomed. Head: Normocephalic, atraumatic. Neck is supple. Mallampati 3,  neck circumference: 21 inches . Nasal airflow patent.   Retrognathia is not seen. Small lower jaw, crowded.  Dental status: biological  Cardiovascular:  irregular rate and cardiac rhythm by pulse, . Respiratory: Lungs are clear to auscultation.  Skin:  Without evidence of ankle edema, or rash. Trunk: The patient's posture is erect.   Neurologic exam : The patient is awake and alert, oriented to place and time.   Memory subjective described as intact.  Attention span & concentration ability appears normal.  Speech is fluent,  without  dysarthria, dysphonia or aphasia.  Mood and affect are appropriate.   Cranial nerves: no loss of smell or taste reported . Some drooling.  Pupils are equal and briskly reactive to light.  Funduscopic exam:     Extraocular movements in vertical and horizontal planes were intact and without nystagmus. No Diplopia.beginning cataract in the right.  Visual fields by finger perimetry are intact. Hearing was intact to soft voice and finger rubbing.    Facial sensation intact to fine touch. Facial motor strength is symmetric and tongue and uvula move midline.  Neck ROM : rotation, tilt and flexion extension were normal for age and shoulder shrug was symmetrical.    Motor exam:  Symmetric bulk, tone and ROM.   Normal tone without cog wheeling in the left, but biceps cogwheeling in the right- atrophy of triceps, but symmetric grip strength - known shoulder joint damage. .   Sensory:  Fine touch  and vibration were normal.  Proprioception tested in the upper extremities was normal.   Coordination: Rapid alternating movements in the fingers/hands were of normal speed.  The Finger-to-nose maneuver was intact without evidence of ataxia, dysmetria but a mild bilaterally present tremor- with action  Gait and station: Patient could rise unassisted from a seated position, walked without assistive device.  Stance is of normal width/ base and the patient turned with 3 steps.  Toe and heel walk were deferred.  Deep tendon reflexes: in the  upper and lower extremities are symmetric and intact.  Babinski response was deferred .     After spending a total time of 45 minutes face to face and additional time for physical and neurologic examination, review of laboratory studies,  personal review of imaging studies, reports and results of other testing and review of referral information / records as far as provided in visit, I have established the following assessments:  1) atrial fib in a patient with known snoring, and with witnessed apnea after anesthesia.  2) high risk by Mallampati, neck size and BMI.  3) muscle tone abnormalities related to sports injuries.   My Plan is to proceed with:  1) attended sleep  study  for heart rhythm tracing in apnea.  2) plan B would be HST  3) advised to reduce alcohol and caffeine intake. Encouraged further weight loss.   I would like to thank Venita Lick, NP and Venita Lick, [address] [place],  [place] [address] for allowing me to meet with and to take care of this pleasant patient.   In short, Joseph [name] is presenting with atrial fib.   I plan to follow up either personally or through our NP within 2-3 month.     Electronically signed by: Larey Seat, MD 10/30/2020 9:11 AM  [facility] Board certified by The [employer] of Sleep Medicine and Diplomate of the [employer] of Sleep Medicine. Board certified In Neurology through the [place], Fellow of the [employer] of Neurology. Medical Director of [facility].

## 2020-11-04 ENCOUNTER — Other Ambulatory Visit: Payer: Self-pay | Admitting: Cardiovascular Disease

## 2020-11-05 ENCOUNTER — Other Ambulatory Visit: Payer: Self-pay | Admitting: Cardiovascular Disease

## 2020-11-05 DIAGNOSIS — I4891 Unspecified atrial fibrillation: Secondary | ICD-10-CM

## 2020-11-06 NOTE — Telephone Encounter (Signed)
Please schedule overdue F/U appointment with Dr. Fletcher Anon or APP. Thank you!

## 2020-11-06 NOTE — Telephone Encounter (Signed)
LVM for patient to call back. ?

## 2020-11-12 ENCOUNTER — Telehealth: Payer: Self-pay

## 2020-11-12 NOTE — Telephone Encounter (Signed)
LVM for pt to call me back to schedule sleep study  

## 2020-11-28 ENCOUNTER — Ambulatory Visit (INDEPENDENT_AMBULATORY_CARE_PROVIDER_SITE_OTHER): Payer: BC Managed Care – PPO | Admitting: Neurology

## 2020-11-28 DIAGNOSIS — Z9189 Other specified personal risk factors, not elsewhere classified: Secondary | ICD-10-CM

## 2020-11-28 DIAGNOSIS — G4733 Obstructive sleep apnea (adult) (pediatric): Secondary | ICD-10-CM | POA: Diagnosis not present

## 2020-11-28 DIAGNOSIS — I480 Paroxysmal atrial fibrillation: Secondary | ICD-10-CM

## 2020-11-28 NOTE — Telephone Encounter (Signed)
Attempted to schedule.  

## 2020-11-29 NOTE — Progress Notes (Signed)
   [facility] TEST (Watch PAT) REPORT  STUDY DATE: 11-28-20  DOB: Sep 21, 1960  MRN: [id number]  ORDERING CLINICIAN: Larey Seat, MD   REFERRING CLINICIAN: Venita Lick, NP   CLINICAL INFORMATION/HISTORY: Joseph [name] is a right -handed Caucasian male patient with a witnessed  sleep disorder who  has a past medical history of Atrial fibrillation ( January 2022/ [facility]), Hernia, hiatal, and osteo-knee arthritis, Obesity and RVR-Tachycardia.  Epworth sleepiness score: 6/24.  BMI: 37.3 kg/m  Neck Circumference: 21"  FINDINGS:   Sleep Summary:   Total Recording Time (hours, min): 7 h 50 min  Total Sleep Time (hours, min):  6 h 56 min   Percent REM (%):    27.87 %   Respiratory Indices:   Calculated pAHI (per hour):  13.0/hour        REM pAHI:    22.8/hour      NREM pAHI: 9.2/hour Supine AHI: 16.4/hour  Oxygen Saturation Statistics:    Oxygen Saturation (%) Mean:  90%  Minimum oxygen saturation (%):   80%  O2 Saturation Range (%): 80-98%   O2 Saturation (minutes) <89 %:  34.9 min  Pulse Rate Statistics:   Pulse Mean (bpm):   56/min    Pulse Range 51-99 beats per minute  IMPRESSION: This HST confirms the presence of mild OSA (obstructive sleep apnea) with 35 minutes of hypoxia. Normal variability of heart rate was noted, heart rhythm cannot be determined from HST data.   The RDI was identical to the AHI , indicating that snoring was either not present or did not cause additional arousals from sleep.   RECOMMENDATION:  There is clear REM sleep dependency for this mild apnea and prolonged oxygen desaturation during sleep, making PAP ( Positive airway pressure ) the therapy of choice. Since the patient was diagnosed with atrial fibrillation, the CPAP therapy offers the most benefit for him.  I recommend starting therapy on an auto CPAP titration device, pressures from 6-16 cm water, 2 cm EPR , heated humidification and interface of patient's  comfort.       INTERPRETING PHYSICIAN:  Larey Seat, MD    [facility] and [facility] Sleep Board certified by The [employer] of Sleep Medicine and Diplomate of the [employer] of Sleep Medicine. Board certified In Neurology through the [place], Fellow of the [employer] of Neurology. Medical Director of [employer].

## 2020-12-03 ENCOUNTER — Other Ambulatory Visit: Payer: Self-pay | Admitting: Cardiovascular Disease

## 2020-12-13 ENCOUNTER — Encounter: Payer: Self-pay | Admitting: Neurology

## 2020-12-13 ENCOUNTER — Telehealth: Payer: Self-pay | Admitting: Neurology

## 2020-12-13 NOTE — Addendum Note (Signed)
Addended by: Larey Seat on: 12/13/2020 08:38 AM   Modules accepted: Orders

## 2020-12-13 NOTE — Procedures (Signed)
[facility] TEST (Watch PAT) REPORT  STUDY DATE: 11-28-20  DOB: 15-Jun-1960  MRN: [id number]  ORDERING CLINICIAN: Larey Seat, MD   REFERRING CLINICIAN: Venita Lick, NP   CLINICAL INFORMATION/HISTORY: Joseph [name] is a right -handed Caucasian male patient with a witnessed  sleep disorder who  has a past medical history of Atrial fibrillation ( January 2022/ [facility]), Hernia, hiatal, and osteo-knee arthritis, Obesity and RVR-Tachycardia.  Epworth sleepiness score: 6/24.  BMI: 37.3 kg/m  Neck Circumference: 21"  Sleep Summary:   Total Recording Time (hours, min): 7 h 50 min  Total Sleep Time (hours, min):  6 h 56 min   Percent REM (%):    27.87 %   Respiratory Indices:   Calculated pAHI (per hour):  13.0/hour        REM pAHI:    22.8/hour      NREM pAHI: 9.2/hour Supine AHI: 16.4/hour  Oxygen Saturation Statistics:    Oxygen Saturation (%) Mean:  90%  Minimum oxygen saturation (%):   80%  O2 Saturation Range (%): 80-98%   O2 Saturation (minutes) <89 %:  34.9 min  Pulse Rate Statistics:   Pulse Mean (bpm):   56/min    Pulse Range 51-99 beats per minute  IMPRESSION: This HST confirms the presence of mild OSA (obstructive sleep apnea) with 35 minutes of hypoxia. Normal variability of heart rate was noted, heart rhythm cannot be determined from HST data.   The RDI was identical to the AHI , indicating that snoring was either not present or did not cause additional arousals from sleep.   RECOMMENDATION:  There is clear REM sleep dependency for this mild apnea and prolonged oxygen desaturation during sleep, making PAP ( Positive airway pressure ) the therapy of choice. Since the patient was diagnosed with atrial fibrillation, the CPAP therapy offers the most benefit for him.  I recommend starting therapy on an auto CPAP titration device, pressures from 6-16 cm water, 2 cm EPR , heated humidification and interface of patient's comfort.        INTERPRETING PHYSICIAN:  Larey Seat, MD    [facility] and [facility] Sleep Board certified by The [employer] of Sleep Medicine and Diplomate of the [employer] of Sleep Medicine. Board certified In Neurology through the [place], Fellow of the [employer] of Neurology. Medical Director of [employer].

## 2020-12-13 NOTE — Telephone Encounter (Signed)
-----   Message from Larey Seat, MD sent at 12/13/2020  8:38 AM EDT ----- IMPRESSION: This HST confirms the presence of mild OSA (obstructive sleep apnea) with 35 minutes of hypoxia. Normal variability of heart rate was noted, heart rhythm cannot be determined from HST data.   The RDI was identical to the AHI , indicating that snoring was either not present or did not cause additional arousals from sleep.  RECOMMENDATION:  There is clear REM sleep dependency for this mild apnea and prolonged oxygen desaturation during sleep, making PAP ( Positive airway pressure ) the therapy of choice. Since the patient was diagnosed with atrial fibrillation, the CPAP therapy offers the most benefit for him.  I recommend starting therapy on an auto CPAP titration device, pressures from 6-16 cm water, 2 cm EPR , heated humidification and interface of patient's comfort.

## 2020-12-13 NOTE — Telephone Encounter (Signed)
Called patient to discuss sleep study results. No answer at this time. LVM for the patient to call back.  Will send a mychart message as well. 

## 2020-12-13 NOTE — Progress Notes (Signed)
IMPRESSION: This HST confirms the presence of mild OSA (obstructive sleep apnea) with 35 minutes of hypoxia. Normal variability of heart rate was noted, heart rhythm cannot be determined from HST data.   The RDI was identical to the AHI , indicating that snoring was either not present or did not cause additional arousals from sleep.  RECOMMENDATION:  There is clear REM sleep dependency for this mild apnea and prolonged oxygen desaturation during sleep, making PAP ( Positive airway pressure ) the therapy of choice. Since the patient was diagnosed with atrial fibrillation, the CPAP therapy offers the most benefit for him.  I recommend starting therapy on an auto CPAP titration device, pressures from 6-16 cm water, 2 cm EPR , heated humidification and interface of patient's comfort.

## 2020-12-15 ENCOUNTER — Other Ambulatory Visit: Payer: Self-pay | Admitting: Cardiovascular Disease

## 2020-12-17 ENCOUNTER — Telehealth: Payer: Self-pay | Admitting: Cardiology

## 2020-12-17 MED ORDER — AMIODARONE HCL 200 MG PO TABS: 200.0000 mg | ORAL_TABLET | Freq: Every day | ORAL | 0 refills | Status: DC

## 2020-12-17 NOTE — Telephone Encounter (Signed)
*  STAT* If patient is at the pharmacy, call can be transferred to refill team.   1. Which medications need to be refilled? (please list name of each medication and dose if known) amiodarone   2. Which pharmacy/location (including street and city if local pharmacy) is medication to [facility] sent to?[facility] [place]  3. Do they need a 30 day or 90 day supply? 90  Scheduled with Quentin Ore for later July

## 2020-12-17 NOTE — Telephone Encounter (Signed)
Requested Prescriptions   Signed Prescriptions Disp Refills   amiodarone (PACERONE) 200 MG tablet 90 tablet 0    Sig: Take 1 tablet (200 mg total) by mouth daily.    Authorizing Provider: Vickie Epley    Ordering User: Raelene Bott, Jevante Hollibaugh L

## 2020-12-27 ENCOUNTER — Encounter: Payer: Self-pay | Admitting: *Deleted

## 2020-12-27 NOTE — Telephone Encounter (Signed)
Took call from phone staff and spoke w/ pt. Relayed results. Pt verbalized understanding. Community message sent to [facility] that order placed. Scheduled appt for 04/03/21 at 9:30am. Sent pt letter detailing phone conversation.

## 2020-12-31 ENCOUNTER — Other Ambulatory Visit: Payer: Self-pay | Admitting: Cardiovascular Disease

## 2021-01-02 ENCOUNTER — Ambulatory Visit: Payer: BC Managed Care – PPO | Admitting: Cardiology

## 2021-01-07 ENCOUNTER — Other Ambulatory Visit: Payer: Self-pay

## 2021-01-07 DIAGNOSIS — Z20822 Contact with and (suspected) exposure to covid-19: Secondary | ICD-10-CM | POA: Diagnosis not present

## 2021-01-07 MED ORDER — APIXABAN 5 MG PO TABS: 5.0000 mg | ORAL_TABLET | Freq: Two times a day (BID) | ORAL | 1 refills | Status: DC

## 2021-01-07 NOTE — Telephone Encounter (Signed)
Refill request for Eliquis received via mychart from pt. Pt's age 60, wt 125.2 kg, SCr 0.85, CrCl 163.66, last ov w/ CL 09/26/20. [facility] [place].

## 2021-01-29 ENCOUNTER — Other Ambulatory Visit: Payer: Self-pay | Admitting: Cardiovascular Disease

## 2021-02-09 ENCOUNTER — Encounter: Payer: Self-pay | Admitting: Nurse Practitioner

## 2021-02-09 DIAGNOSIS — G4733 Obstructive sleep apnea (adult) (pediatric): Secondary | ICD-10-CM | POA: Insufficient documentation

## 2021-02-14 ENCOUNTER — Encounter: Payer: Self-pay | Admitting: Nurse Practitioner

## 2021-02-14 ENCOUNTER — Other Ambulatory Visit: Payer: Self-pay

## 2021-02-14 ENCOUNTER — Ambulatory Visit (INDEPENDENT_AMBULATORY_CARE_PROVIDER_SITE_OTHER): Payer: BC Managed Care – PPO | Admitting: Nurse Practitioner

## 2021-02-14 VITALS — BP 131/88 | HR 101 | Temp 99.0°F | Wt 281.6 lb

## 2021-02-14 DIAGNOSIS — F1020 Alcohol dependence, uncomplicated: Secondary | ICD-10-CM | POA: Insufficient documentation

## 2021-02-14 DIAGNOSIS — Z114 Encounter for screening for human immunodeficiency virus [HIV]: Secondary | ICD-10-CM | POA: Diagnosis not present

## 2021-02-14 DIAGNOSIS — Z1159 Encounter for screening for other viral diseases: Secondary | ICD-10-CM

## 2021-02-14 DIAGNOSIS — I480 Paroxysmal atrial fibrillation: Secondary | ICD-10-CM | POA: Diagnosis not present

## 2021-02-14 DIAGNOSIS — Z136 Encounter for screening for cardiovascular disorders: Secondary | ICD-10-CM | POA: Diagnosis not present

## 2021-02-14 DIAGNOSIS — I77819 Aortic ectasia, unspecified site: Secondary | ICD-10-CM

## 2021-02-14 DIAGNOSIS — D6869 Other thrombophilia: Secondary | ICD-10-CM | POA: Diagnosis not present

## 2021-02-14 DIAGNOSIS — G4733 Obstructive sleep apnea (adult) (pediatric): Secondary | ICD-10-CM

## 2021-02-14 MED ORDER — NALTREXONE HCL 50 MG PO TABS: ORAL_TABLET | ORAL | 4 refills | Status: DC

## 2021-02-14 NOTE — Progress Notes (Signed)
BP 131/88   Pulse (!) 101   Temp 99 F (37.2 C) (Oral)   Wt 281 lb 9.6 oz (127.7 kg)   SpO2 93%   BMI 38.19 kg/m    Subjective:    Patient ID: Joseph [name], male    DOB: 1961-03-09, 60 y.o.   MRN: [id number]  HPI: Joseph [name] is a 60 y.o. male  Chief Complaint  Patient presents with   Health Maintenance   Fatigue    Patient states he is drinking too much and thinks that may be the root to his problem. Patient states he has trouble falling asleep and just always.    ATRIAL FIBRILLATION Has had 3 cardioversions total.  Followed by cardiology, last saw 09/26/20 -- plan next is to schedule ablation.  Taking Amiodarone, Eliquis, Losartan, Metoprolol, Crestor. Atrial fibrillation status: stable Satisfied with current treatment: yes  Medication side effects:  no Medication compliance: good compliance Etiology of atrial fibrillation:  Palpitations:   occasional Chest pain:  no Dyspnea on exertion:  no Orthopnea:  no Syncope:  no Edema:  no Ventricular rate control: B-blocker Anti-coagulation: long acting   FATIGUE Does endorse over past 6 months he has been drinking heavier -- drinking about a 5th of [employer] a day.  Prior to 6 months ago he only drank a few drink on weekends.  Was in [place] program in past for 6 years.  Grandfather was an alcoholic.  Fatigue does correlate with when he started drinking heavier.  At this time drinks heavier on weekends, so Monday morning is very tired.  Will not drink Tuesday and Wednesday, then will feel better.  Has had 3-4 episodes of spitting up scant blood recently.  Does have underlying OSA, CPAP is on the way. Duration:  months Severity: 5/10  Onset: gradual Context when symptoms started:   alcohol use Symptoms improve with rest: yes  Depressive symptoms: yes Stress/anxiety: yes Insomnia: yes hard to fall asleep Snoring: yes Observed apnea by bed partner: yes Daytime hypersomnolence:no Wakes feeling refreshed:  no History of sleep study: yes Dysnea on exertion:  no Orthopnea/PND: no Chest pain: no Chronic cough: no Lower extremity edema: no Arthralgias:no Myalgias: no Weakness: no Rash: no  Flowsheet Row Office Visit from 02/14/2021 in [facility]  Alcohol Use Disorder Identification Test Final Score (AUDIT) 27        Relevant past medical, surgical, family and social history reviewed and updated as indicated. Interim medical history since our last visit reviewed. Allergies and medications reviewed and updated.  Review of Systems  Constitutional:  Positive for fatigue. Negative for activity change, diaphoresis and fever.  Respiratory:  Negative for cough, chest tightness, shortness of breath and wheezing.   Cardiovascular:  Positive for palpitations (occasional). Negative for chest pain and leg swelling.  Gastrointestinal: Negative.   Neurological: Negative.   Psychiatric/Behavioral: Negative.     Per HPI unless specifically indicated above     Objective:    BP 131/88   Pulse (!) 101   Temp 99 F (37.2 C) (Oral)   Wt 281 lb 9.6 oz (127.7 kg)   SpO2 93%   BMI 38.19 kg/m   Wt Readings from Last 3 Encounters:  02/14/21 281 lb 9.6 oz (127.7 kg)  10/30/20 276 lb (125.2 kg)  09/26/20 272 lb (123.4 kg)    Physical Exam Vitals and nursing note reviewed.  Constitutional:      General: He is awake. He is not in acute distress.  Appearance: He is well-developed and well-groomed. He is obese. He is not ill-appearing.  HENT:     Head: Normocephalic and atraumatic.     Right Ear: Hearing normal. No drainage.     Left Ear: Hearing normal. No drainage.  Eyes:     General: Lids are normal.        Right eye: No discharge.        Left eye: No discharge.     Conjunctiva/sclera: Conjunctivae normal.     Pupils: Pupils are equal, round, and reactive to light.  Neck:     Thyroid: No thyromegaly.     Vascular: No carotid bruit.     Trachea: Trachea normal.   Cardiovascular:     Rate and Rhythm: Regular rhythm. Tachycardia present.     Heart sounds: Normal heart sounds, S1 normal and S2 normal. No murmur heard.   No gallop.  Pulmonary:     Effort: Pulmonary effort is normal. No accessory muscle usage or respiratory distress.     Breath sounds: Normal breath sounds.  Abdominal:     General: Bowel sounds are normal.     Palpations: Abdomen is soft. There is no hepatomegaly or splenomegaly.  Musculoskeletal:        General: Normal range of motion.     Cervical back: Normal range of motion and neck supple.     Right lower leg: No edema.     Left lower leg: No edema.  Skin:    General: Skin is warm and dry.     Capillary Refill: Capillary refill takes less than 2 seconds.     Findings: No rash.  Neurological:     Mental Status: He is alert and oriented to person, place, and time.     Deep Tendon Reflexes: Reflexes are normal and symmetric.  Psychiatric:        Attention and Perception: Attention normal.        Mood and Affect: Mood normal.        Speech: Speech normal.        Behavior: Behavior normal. Behavior is cooperative.        Thought Content: Thought content normal.    Results for orders placed or performed during the hospital encounter of 09/12/20  SARS CORONAVIRUS 2 (TAT 6-24 HRS) Nasopharyngeal Nasopharyngeal Swab   Specimen: Nasopharyngeal Swab  Result Value Ref Range   SARS Coronavirus 2 NEGATIVE NEGATIVE      Assessment & Plan:   Problem List Items Addressed This Visit       Cardiovascular and Mediastinum   Atrial fibrillation (HCC)    Chronic, ongoing, followed by cardiology.  Continue current medication regimen as ordered by them and collaboration, recent notes and labs reviewed.  This may be exacerbated by his recent heavier alcohol use, which we will work on reducing.  Return in 4 weeks.      Relevant Orders   Comprehensive metabolic panel   Lipid Panel w/o Chol/HDL Ratio   TSH   CBC with  Differential/Platelet   Aortic dilatation ([place])    Continue collaboration with cardiology, noted on imaging 09/21/20.        Respiratory   OSA (obstructive sleep apnea)    Mild to moderate diagnosed on 12/13/2020.  Is awaiting CPAP equipment, which will be beneficial for overall health.  Discussed with him to use 100% of the time once he receives.        Hematopoietic and Hemostatic   Other thrombophilia ([place])  On Eliquis and has A-Fib.  Continue to monitor closely for increased bruising or bleeding.  CBC annually.        Other   Morbid obesity (HCC)    BMI 38.19 with A-Fib and OSA.  Recommended eating smaller high protein, low fat meals more frequently and exercising 30 mins a day 5 times a week with a goal of 10-15lb weight loss in the next 3 months. Patient voiced their understanding and motivation to adhere to these recommendations.       Alcohol use disorder, moderate, dependence ([place]) - Primary    Over past 6 months has been drinking heavier, which is affecting overall health and causing increased fatigue.  Will trial Naltrexone -- script sent to start at 50 MG daily x one week and then increase to 100 MG daily.  Check labs today to include: CBC, CMP, TSH, Mag, Iron/ferritin, Folic acid.  He plans to start AA, which he has attended in past with benefit.  Return in 4 weeks.      Relevant Orders   Comprehensive metabolic panel   TSH   CBC with Differential/Platelet   Magnesium   Folate   Iron, TIBC and Ferritin Panel   Other Visit Diagnoses     Encounter for screening for HIV       HIV screening today on labs, discussed with patient   Relevant Orders   HIV Antibody (routine testing w rflx)   Need for hepatitis C screening test       Hep C screening today on labs, discussed with patient   Relevant Orders   Hepatitis C antibody        Follow up plan: Return in about 4 weeks (around 03/14/2021) for Alcohol use.

## 2021-02-14 NOTE — Assessment & Plan Note (Signed)
On Eliquis and has A-Fib.  Continue to monitor closely for increased bruising or bleeding.  CBC annually.

## 2021-02-14 NOTE — Assessment & Plan Note (Signed)
Over past 6 months has been drinking heavier, which is affecting overall health and causing increased fatigue.  Will trial Naltrexone -- script sent to start at 50 MG daily x one week and then increase to 100 MG daily.  Check labs today to include: CBC, CMP, TSH, Mag, Iron/ferritin, Folic acid.  He plans to start AA, which he has attended in past with benefit.  Return in 4 weeks.

## 2021-02-14 NOTE — Assessment & Plan Note (Signed)
Mild to moderate diagnosed on 12/13/2020.  Is awaiting CPAP equipment, which will be beneficial for overall health.  Discussed with him to use 100% of the time once he receives.

## 2021-02-14 NOTE — Assessment & Plan Note (Signed)
BMI 38.19 with A-Fib and OSA.  Recommended eating smaller high protein, low fat meals more frequently and exercising 30 mins a day 5 times a week with a goal of 10-15lb weight loss in the next 3 months. Patient voiced their understanding and motivation to adhere to these recommendations.

## 2021-02-14 NOTE — Assessment & Plan Note (Signed)
Continue collaboration with cardiology, noted on imaging 09/21/20.

## 2021-02-14 NOTE — Assessment & Plan Note (Signed)
Chronic, ongoing, followed by cardiology.  Continue current medication regimen as ordered by them and collaboration, recent notes and labs reviewed.  This may be exacerbated by his recent heavier alcohol use, which we will work on reducing.  Return in 4 weeks.

## 2021-02-14 NOTE — Patient Instructions (Signed)
Atrial Fibrillation  Atrial fibrillation is a type of irregular or rapid heartbeat (arrhythmia). In atrial fibrillation, the top part of the heart (atria) beats in an irregular pattern. This makes the heart unable to pump bloodnormally and effectively. The goal of treatment is to prevent blood clots from forming, control your heart rate, or restore your heartbeat to a normal rhythm. If this condition is not treated, it can cause serious problems, such as a weakened heart muscle (cardiomyopathy) or a stroke. What are the causes? This condition is often caused by medical conditions that damage the heart's electrical system. These include: High blood pressure (hypertension). This is the most common cause. Certain heart problems or conditions, such as heart failure, coronary artery disease, heart valve problems, or heart surgery. Diabetes. Overactive thyroid (hyperthyroidism). Obesity. Chronic kidney disease. In some cases, the cause of this condition is not known. What increases the risk? This condition is more likely to develop in: Older people. People who smoke. Athletes who do endurance exercise. People who have a family history of atrial fibrillation. Men. People who use drugs. People who drink a lot of alcohol. People who have lung conditions, such as emphysema, pneumonia, or COPD. People who have obstructive sleep apnea. What are the signs or symptoms? Symptoms of this condition include: A feeling that your heart is racing or beating irregularly. Discomfort or pain in your chest. Shortness of breath. Sudden light-headedness or weakness. Tiring easily during exercise or activity. Fatigue. Syncope (fainting). Sweating. In some cases, there are no symptoms. How is this diagnosed? Your health care provider may detect atrial fibrillation when taking your pulse. If detected, this condition may be diagnosed with: An electrocardiogram (ECG) to check electrical signals of the  heart. An ambulatory cardiac monitor to record your heart's activity for a few days. A transthoracic echocardiogram (TTE) to create pictures of your heart. A transesophageal echocardiogram (TEE) to create even closer pictures of your heart. A stress test to check your blood supply while you exercise. Imaging tests, such as a CT scan or chest X-ray. Blood tests. How is this treated? Treatment depends on underlying conditions and how you feel when you experience atrial fibrillation. This condition may be treated with: Medicines to prevent blood clots or to treat heart rate or heart rhythm problems. Electrical cardioversion to reset the heart's rhythm. A pacemaker to correct abnormal heart rhythm. Ablation to remove the heart tissue that sends abnormal signals. Left atrial appendage closure to seal the area where blood clots can form. In some cases, underlying conditions will be treated. Follow these instructions at home: Medicines Take over-the counter and prescription medicines only as told by your health care provider. Do not take any new medicines without talking to your health care provider. If you are taking blood thinners: Talk with your health care provider before you take any medicines that contain aspirin or NSAIDs, such as ibuprofen. These medicines increase your risk for dangerous bleeding. Take your medicine exactly as told, at the same time every day. Avoid activities that could cause injury or bruising, and follow instructions about how to prevent falls. Wear a medical alert bracelet or carry a card that lists what medicines you take. Lifestyle     Do not use any products that contain nicotine or tobacco, such as cigarettes, e-cigarettes, and chewing tobacco. If you need help quitting, ask your health care provider. Eat heart-healthy foods. Talk with a dietitian to make an eating plan that is right for you. Exercise regularly as told by   your health care provider. Do not  drink alcohol. Lose weight if you are overweight. Do not use drugs, including cannabis. General instructions If you have obstructive sleep apnea, manage your condition as told by your health care provider. Do not use diet pills unless your health care provider approves. Diet pills can make heart problems worse. Keep all follow-up visits as told by your health care provider. This is important. Contact a health care provider if you: Notice a change in the rate, rhythm, or strength of your heartbeat. Are taking a blood thinner and you notice more bruising. Tire more easily when you exercise or do heavy work. Have a sudden change in weight. Get help right away if you have:  Chest pain, abdominal pain, sweating, or weakness. Trouble breathing. Side effects of blood thinners, such as blood in your vomit, stool, or urine, or bleeding that cannot stop. Any symptoms of a stroke. "BE FAST" is an easy way to remember the main warning signs of a stroke: B - Balance. Signs are dizziness, sudden trouble walking, or loss of balance. E - Eyes. Signs are trouble seeing or a sudden change in vision. F - Face. Signs are sudden weakness or numbness of the face, or the face or eyelid drooping on one side. A - Arms. Signs are weakness or numbness in an arm. This happens suddenly and usually on one side of the body. S - Speech. Signs are sudden trouble speaking, slurred speech, or trouble understanding what people say. T - Time. Time to call emergency services. Write down what time symptoms started. Other signs of a stroke, such as: A sudden, severe headache with no known cause. Nausea or vomiting. Seizure. These symptoms may represent a serious problem that is an emergency. Do not wait to see if the symptoms will go away. Get medical help right away. Call your local emergency services (911 in the [place] Do not drive yourself to the hospital. Summary Atrial fibrillation is a type of irregular or rapid  heartbeat (arrhythmia). Symptoms include a feeling that your heart is beating fast or irregularly. You may be given medicines to prevent blood clots or to treat heart rate or heart rhythm problems. Get help right away if you have signs or symptoms of a stroke. Get help right away if you cannot catch your breath or have chest pain or pressure. This information is not intended to replace advice given to you by your health care provider. Make sure you discuss any questions you have with your healthcare provider. Document Revised: 11/17/2018 Document Reviewed: 11/17/2018 Elsevier Patient Education  2022 [employer]  

## 2021-02-15 LAB — COMPREHENSIVE METABOLIC PANEL
ALT: 48 IU/L — ABNORMAL HIGH (ref 0–44)
AST: 54 IU/L — ABNORMAL HIGH (ref 0–40)
Albumin/Globulin Ratio: 2 (ref 1.2–2.2)
Albumin: 4.4 g/dL (ref 3.8–4.9)
Alkaline Phosphatase: 66 IU/L (ref 44–121)
BUN/Creatinine Ratio: 12 (ref 10–24)
BUN: 12 mg/dL (ref 8–27)
Bilirubin Total: 1.1 mg/dL (ref 0.0–1.2)
CO2: 21 mmol/L (ref 20–29)
Calcium: 9.4 mg/dL (ref 8.6–10.2)
Chloride: 102 mmol/L (ref 96–106)
Creatinine, Ser: 1.03 mg/dL (ref 0.76–1.27)
Globulin, Total: 2.2 g/dL (ref 1.5–4.5)
Glucose: 100 mg/dL — ABNORMAL HIGH (ref 65–99)
Potassium: 3.6 mmol/L (ref 3.5–5.2)
Sodium: 139 mmol/L (ref 134–144)
Total Protein: 6.6 g/dL (ref 6.0–8.5)
eGFR: 83 mL/min/{1.73_m2} (ref >59)

## 2021-02-15 LAB — HIV ANTIBODY (ROUTINE TESTING W REFLEX): HIV Screen 4th Generation wRfx: NONREACTIVE

## 2021-02-15 LAB — CBC WITH DIFFERENTIAL/PLATELET
Basophils Absolute: 0 10*3/uL (ref 0.0–0.2)
Basos: 1 %
EOS (ABSOLUTE): 0.2 10*3/uL (ref 0.0–0.4)
Eos: 4 %
Hematocrit: 42.9 % (ref 37.5–51.0)
Hemoglobin: 14.8 g/dL (ref 13.0–17.7)
Immature Grans (Abs): 0 10*3/uL (ref 0.0–0.1)
Immature Granulocytes: 0 %
Lymphocytes Absolute: 1.6 10*3/uL (ref 0.7–3.1)
Lymphs: 26 %
MCH: 35.7 pg — ABNORMAL HIGH (ref 26.6–33.0)
MCHC: 34.5 g/dL (ref 31.5–35.7)
MCV: 103 fL — ABNORMAL HIGH (ref 79–97)
Monocytes Absolute: 0.5 10*3/uL (ref 0.1–0.9)
Monocytes: 9 %
Neutrophils Absolute: 3.9 10*3/uL (ref 1.4–7.0)
Neutrophils: 60 %
Platelets: 110 10*3/uL — ABNORMAL LOW (ref 150–450)
RBC: 4.15 x10E6/uL (ref 4.14–5.80)
RDW: 12.5 % (ref 11.6–15.4)
WBC: 6.3 10*3/uL (ref 3.4–10.8)

## 2021-02-15 LAB — MAGNESIUM: Magnesium: 1.7 mg/dL (ref 1.6–2.3)

## 2021-02-15 LAB — IRON,TIBC AND FERRITIN PANEL
Ferritin: 2189 ng/mL — ABNORMAL HIGH (ref 30–400)
Iron Saturation: 91 % (ref 15–55)
Iron: 296 ug/dL (ref 38–169)
Total Iron Binding Capacity: 327 ug/dL (ref 250–450)
UIBC: 31 ug/dL — ABNORMAL LOW (ref 111–343)

## 2021-02-15 LAB — LIPID PANEL W/O CHOL/HDL RATIO
Cholesterol, Total: 134 mg/dL (ref 100–199)
HDL: 56 mg/dL (ref >39)
LDL Chol Calc (NIH): 44 mg/dL (ref 0–99)
Triglycerides: 213 mg/dL — ABNORMAL HIGH (ref 0–149)
VLDL Cholesterol Cal: 34 mg/dL (ref 5–40)

## 2021-02-15 LAB — TSH: TSH: 4.23 u[IU]/mL (ref 0.450–4.500)

## 2021-02-15 LAB — HEPATITIS C ANTIBODY: Hep C Virus Ab: <0.1 s/co ratio (ref 0.0–0.9)

## 2021-02-15 LAB — FOLATE: Folate: 16.4 ng/mL (ref >3.0)

## 2021-02-15 NOTE — Progress Notes (Signed)
Contacted via [place] morning Joseph [name], your labs have returned: - Kidney function is normal, creatinine and eGFR.  Liver function, AST and ALT, is showing some mild elevation, definitely cut back on alcohol. - Hep C and HIV are negative. - Cholesterol levels look good with exception of triglycerides, but if you cut back on alcohol it will help these. - Thyroid is normal and CBC shows no anemia, but platelets remain on low side.  They have been on lower side for over a year, we will continue to monitor these and cutting back on alcohol is a good idea to help these stay stable. - Iron level is quite elevated, if you take supplements at home stop these (like vitamins) and cut back on alcohol as we discussed.  Will recheck next visit.  Any questions or concerns?  How are you doing? Keep being amazing!!  Thank you for allowing me to participate in your care.  I appreciate you. Kindest regards, Jerre Diguglielmo

## 2021-02-20 ENCOUNTER — Encounter: Payer: Self-pay | Admitting: Cardiology

## 2021-02-20 ENCOUNTER — Ambulatory Visit (INDEPENDENT_AMBULATORY_CARE_PROVIDER_SITE_OTHER): Payer: BC Managed Care – PPO | Admitting: Cardiology

## 2021-02-20 ENCOUNTER — Other Ambulatory Visit: Payer: Self-pay

## 2021-02-20 VITALS — BP 122/88 | HR 64 | Ht 72.0 in | Wt 286.0 lb

## 2021-02-20 DIAGNOSIS — B369 Superficial mycosis, unspecified: Secondary | ICD-10-CM

## 2021-02-20 DIAGNOSIS — I4819 Other persistent atrial fibrillation: Secondary | ICD-10-CM

## 2021-02-20 DIAGNOSIS — I5022 Chronic systolic (congestive) heart failure: Secondary | ICD-10-CM | POA: Diagnosis not present

## 2021-02-20 DIAGNOSIS — E669 Obesity, unspecified: Secondary | ICD-10-CM | POA: Diagnosis not present

## 2021-02-20 DIAGNOSIS — G4733 Obstructive sleep apnea (adult) (pediatric): Secondary | ICD-10-CM | POA: Diagnosis not present

## 2021-02-20 MED ORDER — NYSTATIN 100000 UNIT/GM EX POWD: 1.0000 "application " | Freq: Three times a day (TID) | CUTANEOUS | 0 refills | Status: DC

## 2021-02-20 NOTE — Progress Notes (Signed)
Electrophysiology Office Follow up Visit Note:    Date:  02/20/2021   ID:  Joseph [name], DOB 1961/02/06, MRN [id number]  PCP:  Venita Lick, NP  [facility] Cardiologist:  None  [facility] Electrophysiologist:  Vickie Epley, MD    Interval History:    Joseph [name] is a 60 y.o. male who presents for a follow up visit. They were last seen in clinic 09/26/2020. He is on amiodarone and eliquis.  Since I saw him he started CPAP after a sleep study showed mild OSA.  He has done well since I last saw him.  He tells me that his CPAP machine should be arriving any day now.  He does have a bilateral inguinal fungal rash.  It improved after nystatin cream but then it returned after stopping therapy.  He tells me the area is staying moist.   Past Medical History:  Diagnosis Date   Atrial fibrillation ([facility])    Hernia, hiatal    20 years ago   Tachycardia     Past Surgical History:  Procedure Laterality Date   CARDIOVERSION N/A 07/10/2020   Procedure: CARDIOVERSION;  Surgeon: Nelva Bush, MD;  Location: [facility] ORS;  Service: Cardiovascular;  Laterality: N/A;   CARDIOVERSION N/A 07/30/2020   Procedure: CARDIOVERSION;  Surgeon: Wellington Hampshire, MD;  Location: [facility];  Service: Cardiovascular;  Laterality: N/A;   CARDIOVERSION N/A 09/14/2020   Procedure: CARDIOVERSION;  Surgeon: Wellington Hampshire, MD;  Location: [facility] ORS;  Service: Cardiovascular;  Laterality: N/A;   COLONOSCOPY WITH PROPOFOL N/A 12/02/2019   Procedure: COLONOSCOPY WITH PROPOFOL;  Surgeon: Jonathon Bellows, MD;  Location: [facility] ENDOSCOPY;  Service: Gastroenterology;  Laterality: N/A;   HIATAL HERNIA REPAIR     TEE WITHOUT CARDIOVERSION N/A 07/10/2020   Procedure: TEE with Cardioversion;  Surgeon: Nelva Bush, MD;  Location: [facility] ORS;  Service: Cardiovascular;  Laterality: N/A;    Current Medications: Current Meds  Medication Sig   acetaminophen (TYLENOL) 500 MG tablet Take 1,000 mg by mouth  every 6 (six) hours as needed for mild pain or moderate pain.   amiodarone (PACERONE) 200 MG tablet Take 1 tablet (200 mg total) by mouth daily.   apixaban (ELIQUIS) 5 MG TABS tablet Take 1 tablet (5 mg total) by mouth 2 (two) times daily.   loratadine (CLARITIN) 10 MG tablet Take 10 mg by mouth daily.   losartan (COZAAR) 25 MG tablet Take 1 tablet (25 mg total) by mouth daily.   metoprolol succinate (TOPROL-XL) 50 MG 24 hr tablet Take 1 tablet (50 mg total) by mouth daily. Please keep schedule office visit for further refills. Thank you!   naproxen sodium (ALEVE) 220 MG tablet Take 220 mg by mouth as needed.   nystatin (MYCOSTATIN/NYSTOP) powder Apply 1 application topically 3 (three) times daily.   rosuvastatin (CRESTOR) 10 MG tablet Take 1 tablet (10 mg total) by mouth daily.     Allergies:   Patient has no known allergies.   Social History   Socioeconomic History   Marital status: Married    Spouse name: Not on file   Number of children: Not on file   Years of education: Not on file   Highest education level: Not on file  Occupational History    Comment: [employer]  Tobacco Use   Smoking status: Never   Smokeless tobacco: Current    Types: Chew  Vaping Use   Vaping Use: Never used  Substance and Sexual Activity   Alcohol  use: Yes    Comment: 12 pack on the weekend   Drug use: Never   Sexual activity: Yes  Other Topics Concern   Not on file  Social History Narrative   Caffeine use: 2 diet cokes at lunch, coffee sometimes in the morning   Right handed   Social Determinants of Health   Financial Resource Strain: Not on file  Food Insecurity: Not on file  Transportation Needs: Not on file  Physical Activity: Not on file  Stress: Not on file  Social Connections: Not on file     Family History: The patient's family history includes COPD in his sister; Leukemia in his father.  ROS:   Please see the history of present illness.    All other systems reviewed and  are negative.  EKGs/Labs/Other Studies Reviewed:    The following studies were reviewed today:  09/21/2020 Echo 1. Left ventricular ejection fraction, by estimation, is 30 to 35%. The  left ventricle has moderate to severely decreased function. The left  ventricle demonstrates global hypokinesis. There is mild left ventricular  hypertrophy. Left ventricular  diastolic parameters are indeterminate.   2. Right ventricular systolic function is normal. The right ventricular  size is normal.   3. Left atrial size was mildly dilated.   4. The mitral valve is grossly normal. No evidence of mitral valve  regurgitation.   5. The aortic valve was not well visualized. Aortic valve regurgitation  is not visualized. Mild aortic valve sclerosis is present, with no  evidence of aortic valve stenosis.   6. Aortic dilatation noted. There is moderate dilatation of the ascending  aorta, measuring 48 mm. There is mild dilatation of the aortic root,  measuring 39 mm.   7. The inferior vena cava is normal in size with greater than 50%  respiratory variability, suggesting right atrial pressure of 3 mmHg.       EKG:  The ekg ordered today demonstrates sinus rhythm.  QTC is 480 ms.  PR is 208 ms.  Recent Labs: 02/14/2021: ALT 48; BUN 12; Creatinine, Ser 1.03; Hemoglobin 14.8; Magnesium 1.7; Platelets 110; Potassium 3.6; Sodium 139; TSH 4.230  Recent Lipid Panel    Component Value Date/Time   CHOL 134 02/14/2021 0845   TRIG 213 (H) 02/14/2021 0845   HDL 56 02/14/2021 0845   CHOLHDL 3.3 08/12/2019 0837   LDLCALC 44 02/14/2021 0845    Physical Exam:    VS:  BP 122/88 (BP Location: Left Arm, Patient Position: Sitting, Cuff Size: Normal)   Pulse 64   Ht 6' (1.829 m)   Wt 286 lb (129.7 kg)   SpO2 97%   BMI 38.79 kg/m     Wt Readings from Last 3 Encounters:  02/20/21 286 lb (129.7 kg)  02/14/21 281 lb 9.6 oz (127.7 kg)  10/30/20 276 lb (125.2 kg)     GEN:  Well nourished, well developed in  no acute distress.  Obese HEENT: Normal NECK: No JVD; No carotid bruits LYMPHATICS: No lymphadenopathy CARDIAC: RRR, no murmurs, rubs, gallops RESPIRATORY:  Clear to auscultation without rales, wheezing or rhonchi  ABDOMEN: Soft, non-tender, non-distended MUSCULOSKELETAL:  No edema; No deformity  SKIN: Warm.  Bilateral inguinal fold erythematous rash.  Area is moist.  Appears consistent with fungal. NEUROLOGIC:  Alert and oriented x 3 PSYCHIATRIC:  Normal affect   ASSESSMENT:    1. Persistent atrial fibrillation ([place])   2. Chronic systolic heart failure (HCC)   3. Obesity (BMI 30-39.9)   4.  OSA (obstructive sleep apnea)   5. Fungal rash of torso    PLAN:    In order of problems listed above:   1. Persistent atrial fibrillation (HCC) Maintaining sinus rhythm on amiodarone.  Recent lab work in September shows stable liver function and TSH.  I would continue the current dose of 200 mg by mouth once daily.  I will have him continue taking Eliquis 5 mg by mouth twice daily.  He would like to go ahead and schedule his ablation which I think is very reasonable at this point in an effort to avoid long-term exposure to amiodarone given his young age.  Ablation strategy will be PVI plus posterior wall.  Risk, benefits, and alternatives to EP study and radiofrequency ablation for afib were also discussed in detail today. These risks include but are not limited to stroke, bleeding, vascular damage, tamponade, perforation, damage to the esophagus, lungs, and other structures, pulmonary vein stenosis, worsening renal function, and death. The patient understands these risk and wishes to proceed.  We will therefore proceed with catheter ablation at the next available time.  Carto, ICE, anesthesia are requested for the procedure.  Will also obtain CT PV protocol prior to the procedure to exclude LAA thrombus and further evaluate atrial anatomy.   2. Chronic systolic heart failure (HCC) NYHA class  II.  Rhythm control indicated.  Continue losartan, metoprolol, amiodarone.  3. Obesity (BMI 30-39.9) Weight loss encouraged  4. OSA (obstructive sleep apnea) CPAP will be started in the next several days.  5. Fungal rash of torso Nystatin powder for 10 days.  If no improvement or recurrence, I have instructed him to follow-up with his primary care physician.    Medication Adjustments/Labs and Tests Ordered: Current medicines are reviewed at length with the patient today.  Concerns regarding medicines are outlined above.  Orders Placed This Encounter  Procedures   EKG 12-Lead    Meds ordered this encounter  Medications   nystatin (MYCOSTATIN/NYSTOP) powder    Sig: Apply 1 application topically 3 (three) times daily.    Dispense:  30 g    Refill:  0      Signed, Lars Mage, MD, [facility], [facility] 02/20/2021 10:21 AM    Electrophysiology [facility]

## 2021-02-20 NOTE — Patient Instructions (Addendum)
Medication Instructions:  Your physician has recommended you make the following change in your medication:   ** Begin using Nystatin Powder as prescribed - If rash has not resolved in 2 weeks please follow up with your PCP.  *If you need a refill on your cardiac medications before your next appointment, please call your pharmacy*   Lab Work: None ordered today  If you have labs (blood work) drawn today and your tests are completely normal, you will receive your results only by: [place] (if you have MyChart) OR A paper copy in the mail If you have any lab test that is abnormal or we need to change your treatment, we will call you to review the results.   Testing/Procedures: Your physician has recommended that you have an ablation. Catheter ablation is a medical procedure used to treat some cardiac arrhythmias (irregular heartbeats). During catheter ablation, a long, thin, flexible tube is put into a blood vessel in your groin (upper thigh), or neck. This tube is called an ablation catheter. It is then guided to your heart through the blood vessel. Radio frequency waves destroy small areas of heart tissue where abnormal heartbeats may cause an arrhythmia to start. Please see the instruction sheet given to you today.  Your physician has requested that you have cardiac CT. Cardiac computed tomography (CT) is a painless test that uses an x-ray machine to take clear, detailed pictures of your heart. For further information please visit HugeFiesta.[email or web address]. Please follow instruction sheet as given.      Follow-Up: At [facility], you and your health needs are our priority.  As part of our continuing mission to provide you with exceptional heart care, we have created designated Provider Care Teams.  These Care Teams include your primary Cardiologist (physician) and Advanced Practice Providers (APPs -  Physician Assistants and Nurse Practitioners) who all work together to provide you with  the care you need, when you need it.  We recommend signing up for the patient portal called "MyChart".  Sign up information is provided on this After Visit Summary.  MyChart is used to connect with patients for Virtual Visits (Telemedicine).  Patients are able to view lab/test results, encounter notes, upcoming appointments, etc.  Non-urgent messages can be sent to your provider as well.   To learn more about what you can do with MyChart, go to [email or web address].    Your next appointment:   Follow up to be scheduled

## 2021-02-21 ENCOUNTER — Other Ambulatory Visit: Payer: Self-pay | Admitting: Cardiovascular Disease

## 2021-02-21 ENCOUNTER — Telehealth: Payer: Self-pay

## 2021-02-21 DIAGNOSIS — Z01812 Encounter for preprocedural laboratory examination: Secondary | ICD-10-CM

## 2021-02-21 DIAGNOSIS — I4891 Unspecified atrial fibrillation: Secondary | ICD-10-CM

## 2021-02-21 DIAGNOSIS — I4819 Other persistent atrial fibrillation: Secondary | ICD-10-CM

## 2021-02-21 DIAGNOSIS — I480 Paroxysmal atrial fibrillation: Secondary | ICD-10-CM

## 2021-02-21 NOTE — Telephone Encounter (Signed)
Attempted phone call to pt and left voicemail message(OK per Epic) re: Ablation Instructions and Cardiac CT Instructions sent to pt's MyChart and hard copy of letters placed in the mail to pt at address listed in Epic.  Pt advised to contact Cristopher Estimable, RN for further questions or concerns.

## 2021-02-27 DIAGNOSIS — G4733 Obstructive sleep apnea (adult) (pediatric): Secondary | ICD-10-CM | POA: Diagnosis not present

## 2021-03-08 NOTE — Addendum Note (Signed)
Addended by: Willeen Cass A on: 03/08/2021 01:14 PM   Modules accepted: Orders

## 2021-03-15 ENCOUNTER — Ambulatory Visit: Payer: BC Managed Care – PPO | Admitting: Nurse Practitioner

## 2021-03-21 ENCOUNTER — Telehealth (HOSPITAL_COMMUNITY): Payer: Self-pay | Admitting: *Deleted

## 2021-03-21 NOTE — Telephone Encounter (Signed)
Reaching out to patient to offer assistance regarding upcoming cardiac imaging study; pt verbalizes understanding of appt date/time.  He was reminded to get blood work prior to appointment but states he is unable to due to his daughter getting married this weekend.  Appointment was moved to 10/20 and he is aware to obtain blood work at [facility] prior to CT scan.  Gordy Clement RN Navigator Cardiac Imaging [facility] [phone] office [phone] cell

## 2021-03-25 ENCOUNTER — Ambulatory Visit (HOSPITAL_COMMUNITY): Admission: RE | Admit: 2021-03-25 | Payer: BC Managed Care – PPO | Source: Ambulatory Visit

## 2021-03-26 ENCOUNTER — Telehealth: Payer: Self-pay | Admitting: Cardiology

## 2021-03-26 NOTE — Telephone Encounter (Signed)
Patient calling to cancel ablation and ct for now.  He previously discussed with Dr. Paschal Dopp itch issue and was advised if not clear to postpone.    Patient states he will call after pcp and dermatology help him clear issue to discuss rescheduling these procedures.   Please cancel CT and ablation.

## 2021-03-27 ENCOUNTER — Other Ambulatory Visit: Payer: BC Managed Care – PPO

## 2021-03-28 ENCOUNTER — Ambulatory Visit (HOSPITAL_COMMUNITY): Payer: BC Managed Care – PPO

## 2021-03-28 NOTE — Telephone Encounter (Signed)
Mychart message sent advising Pt appointments had been cancelled.  Will leave January follow up with Dr. Quentin Ore.  Await further needs.

## 2021-03-29 DIAGNOSIS — G4733 Obstructive sleep apnea (adult) (pediatric): Secondary | ICD-10-CM | POA: Diagnosis not present

## 2021-04-01 ENCOUNTER — Encounter (HOSPITAL_COMMUNITY): Admission: RE | Payer: Self-pay | Source: Home / Self Care

## 2021-04-01 ENCOUNTER — Ambulatory Visit (HOSPITAL_COMMUNITY): Admission: RE | Admit: 2021-04-01 | Payer: BC Managed Care – PPO | Source: Home / Self Care | Admitting: Cardiology

## 2021-04-01 SURGERY — ATRIAL FIBRILLATION ABLATION
Anesthesia: General

## 2021-04-03 ENCOUNTER — Ambulatory Visit: Payer: Self-pay | Admitting: Neurology

## 2021-04-03 ENCOUNTER — Encounter: Payer: Self-pay | Admitting: Neurology

## 2021-04-29 ENCOUNTER — Ambulatory Visit (HOSPITAL_COMMUNITY): Payer: BC Managed Care – PPO | Admitting: Physician Assistant

## 2021-05-16 DIAGNOSIS — G4733 Obstructive sleep apnea (adult) (pediatric): Secondary | ICD-10-CM | POA: Diagnosis not present

## 2021-05-17 DIAGNOSIS — G4733 Obstructive sleep apnea (adult) (pediatric): Secondary | ICD-10-CM | POA: Diagnosis not present

## 2021-05-18 ENCOUNTER — Other Ambulatory Visit: Payer: Self-pay | Admitting: Cardiovascular Disease

## 2021-06-17 DIAGNOSIS — G4733 Obstructive sleep apnea (adult) (pediatric): Secondary | ICD-10-CM | POA: Diagnosis not present

## 2021-06-19 ENCOUNTER — Other Ambulatory Visit: Payer: Self-pay | Admitting: Cardiology

## 2021-06-19 NOTE — Telephone Encounter (Signed)
This is a [place] pt 

## 2021-06-27 IMAGING — DX DG KNEE COMPLETE 4+V*R*
4 series · 4 of 4 positions shown · non-contrast
Comparison: None.

CLINICAL DATA: Chronic knee pain

EXAM:
RIGHT KNEE - COMPLETE 4+ VIEW

[knee ap]
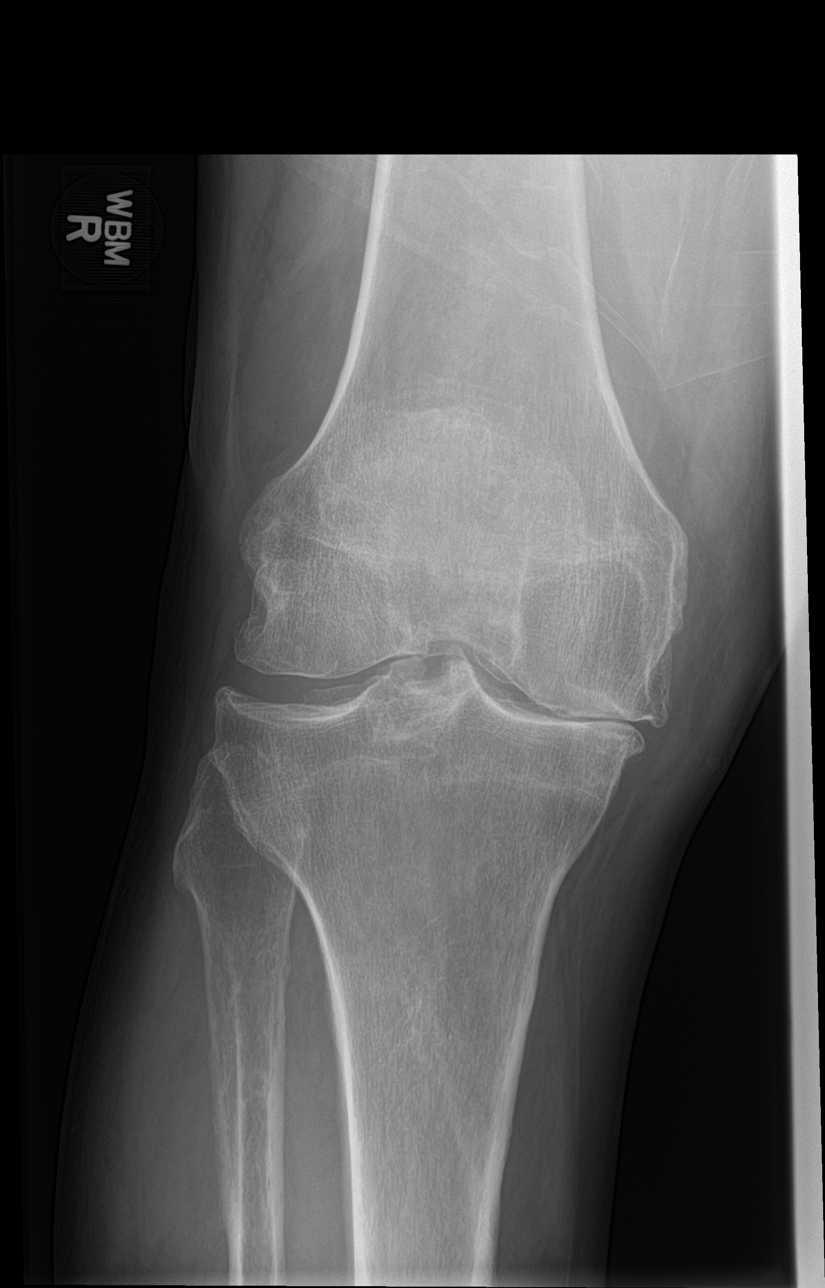

[knee tunnel]
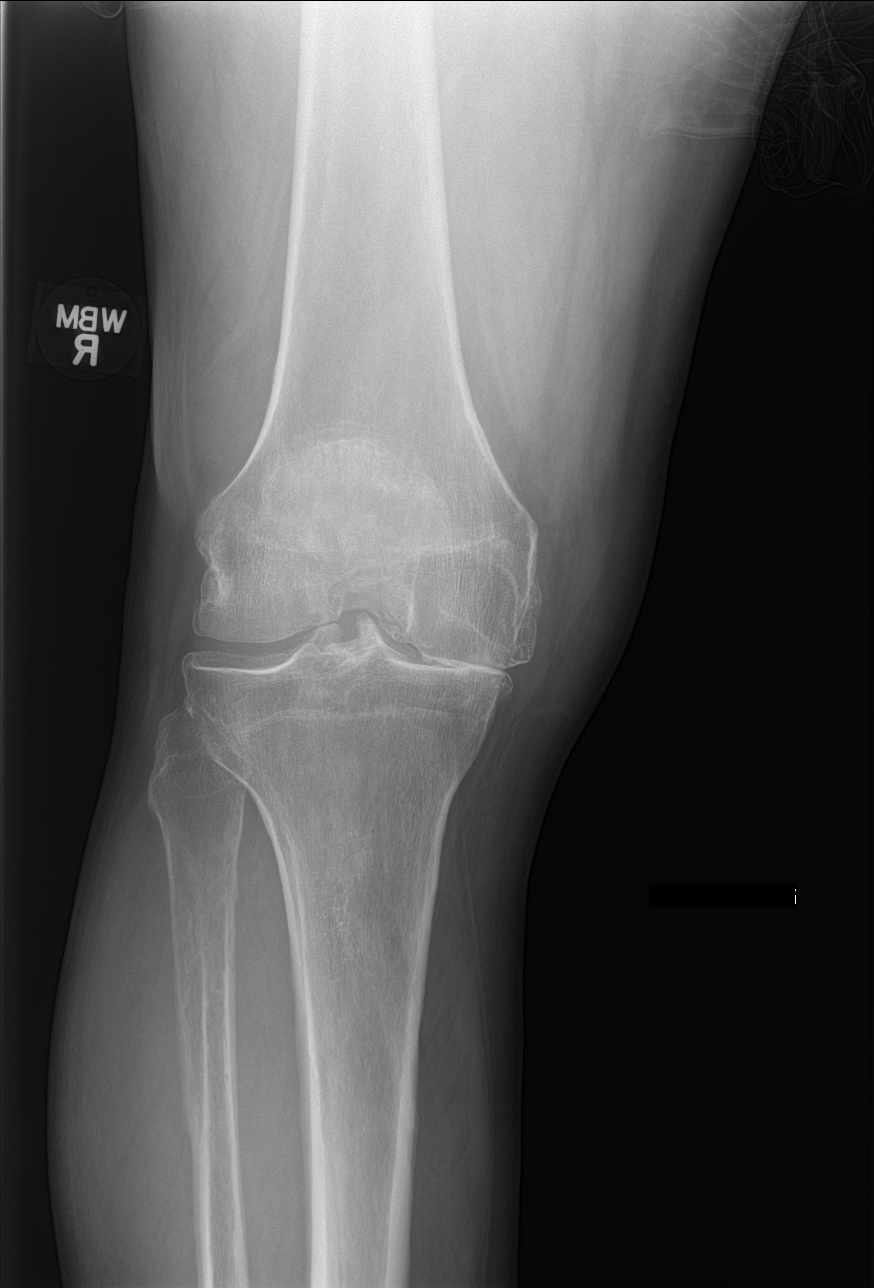

[knee lat]
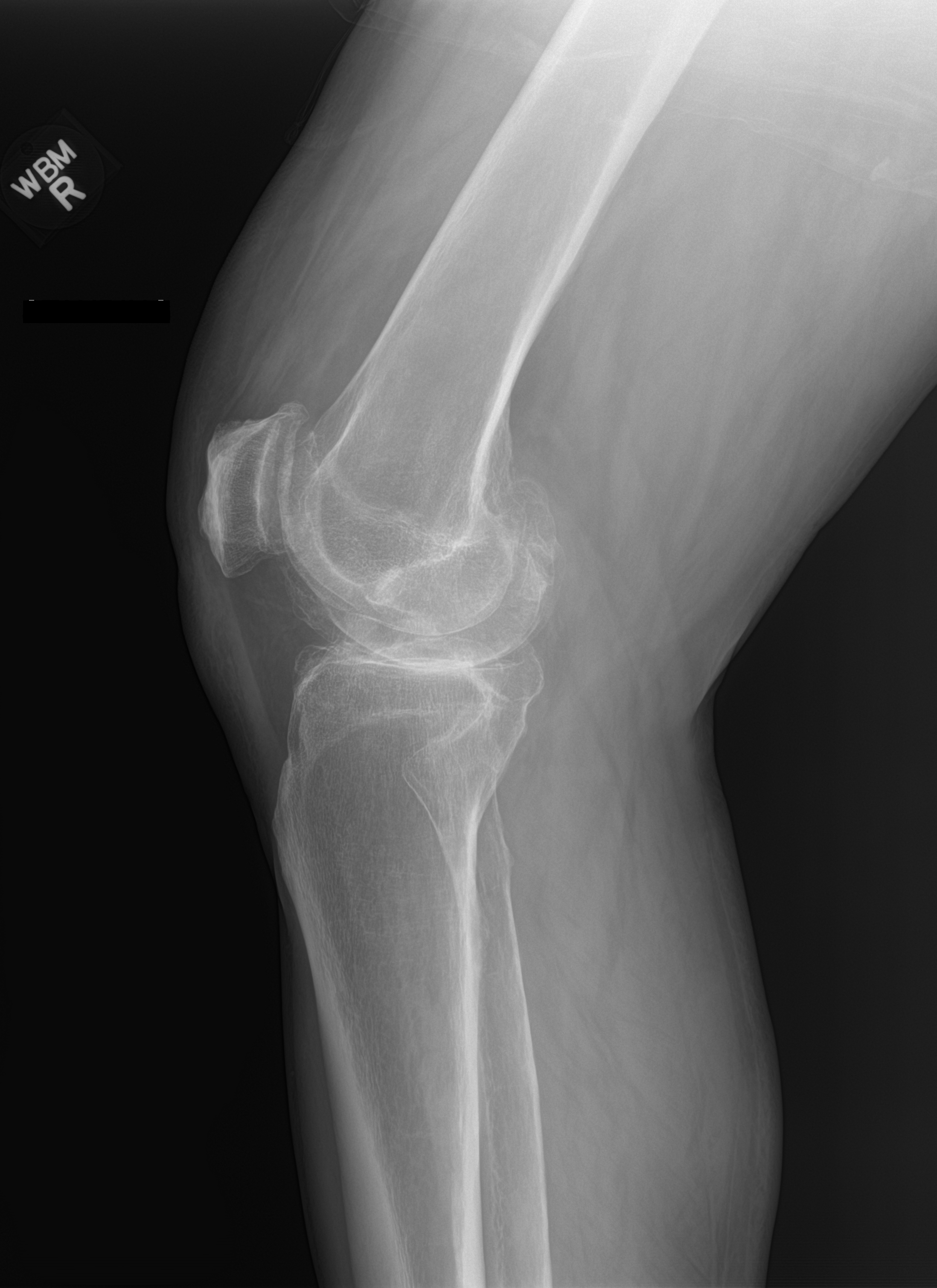

[patella skyline]
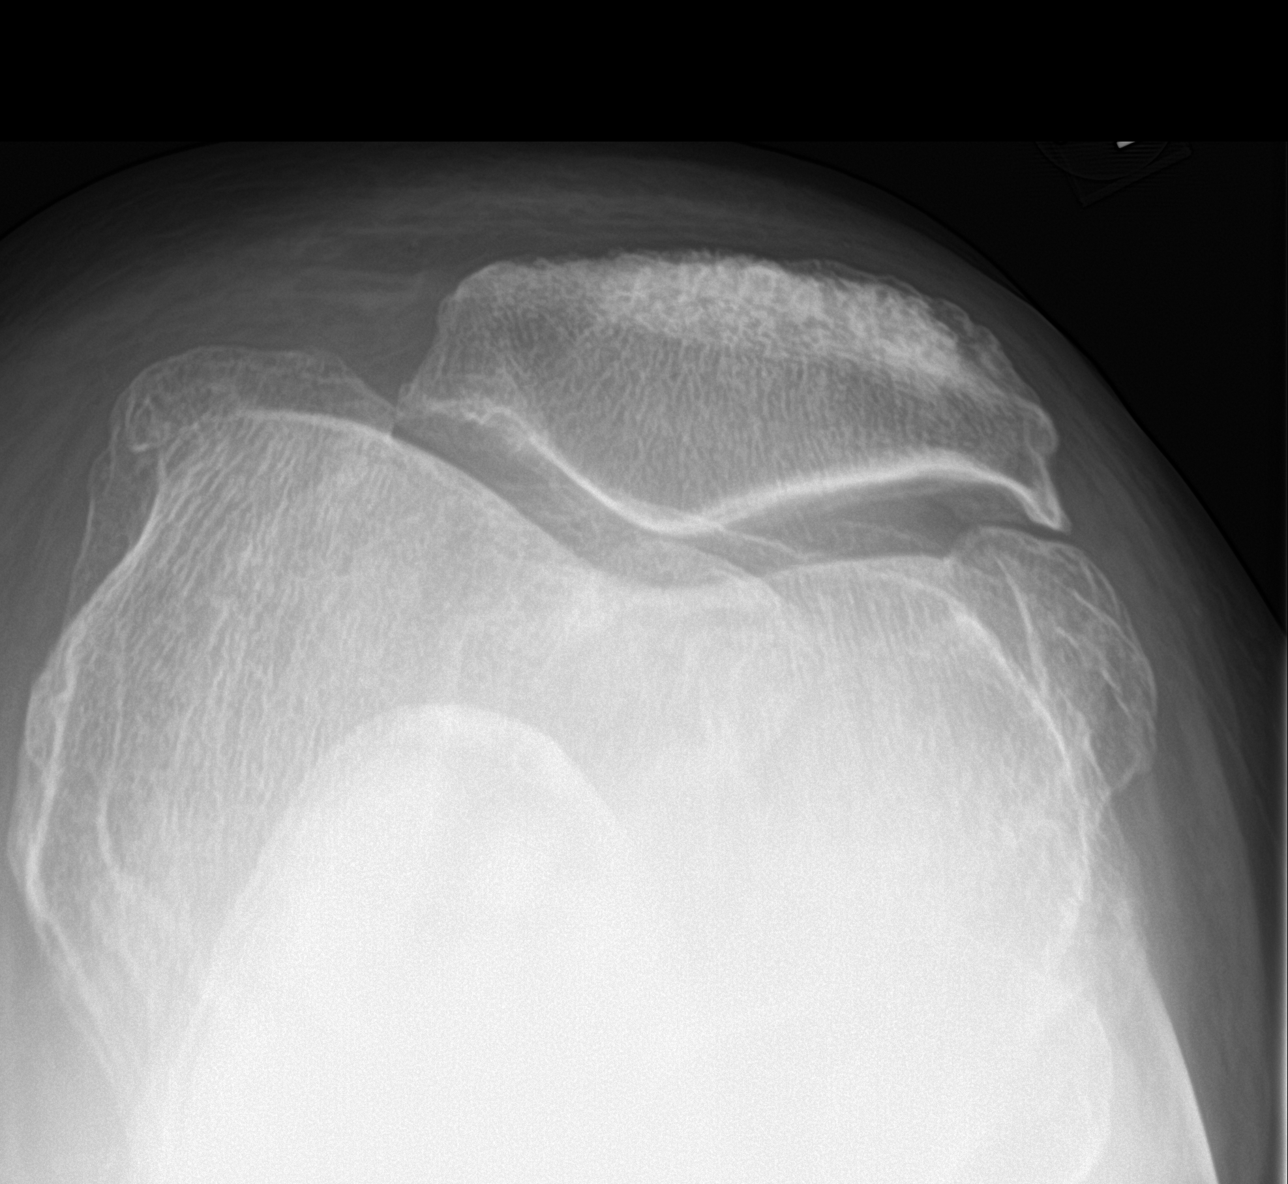

[4 of 4 positions shown; findings below may reference images not displayed]

FINDINGS: No fracture or malalignment. Moderate patellofemoral degenerative
change. Mild lateral joint space degenerative change. Advanced
medial joint space degenerative change with bone on bone appearance.
Possible remote fracture deformity of the proximal fibula. Trace
knee effusion.
IMPRESSION: Tricompartment arthritis of the knee, most severe involving the
medial joint space. Trace knee effusion.

## 2021-06-27 IMAGING — DX DG KNEE COMPLETE 4+V*L*
4 series · 4 of 4 positions shown · non-contrast
Comparison: None.

CLINICAL DATA: Chronic knee pain

EXAM:
LEFT KNEE - COMPLETE 4+ VIEW

[knee ap]
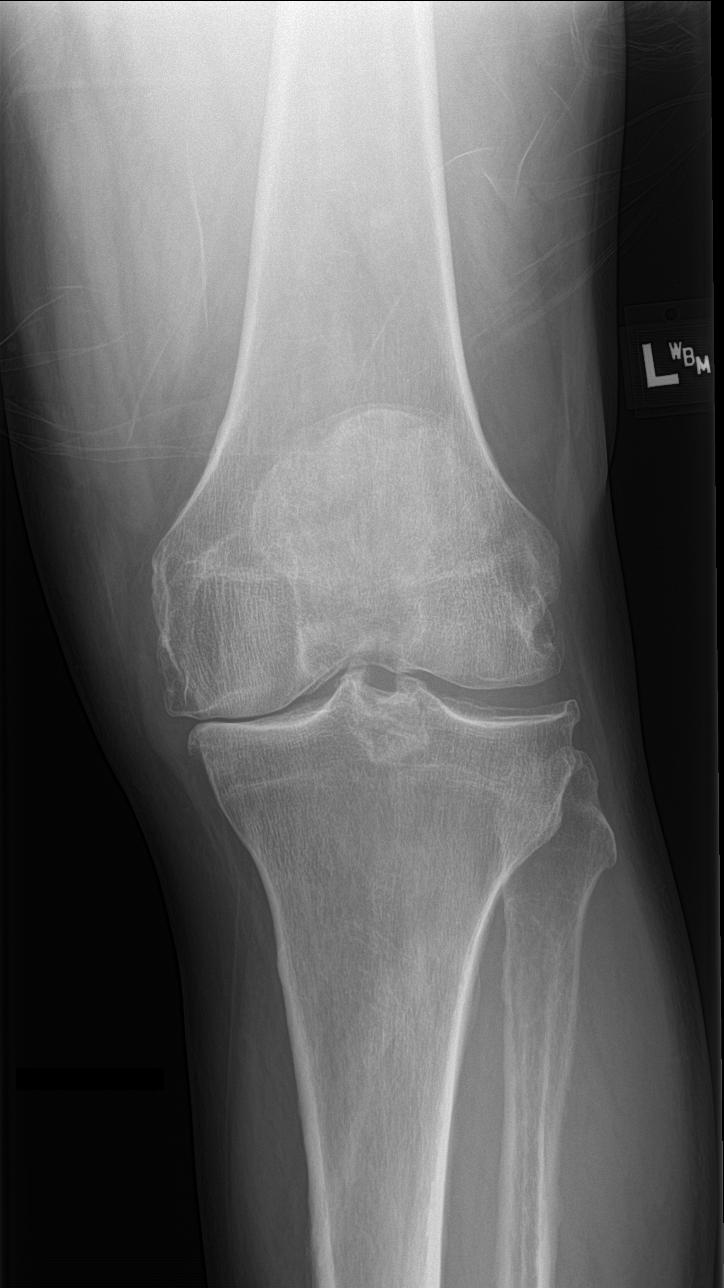

[knee tunnel]
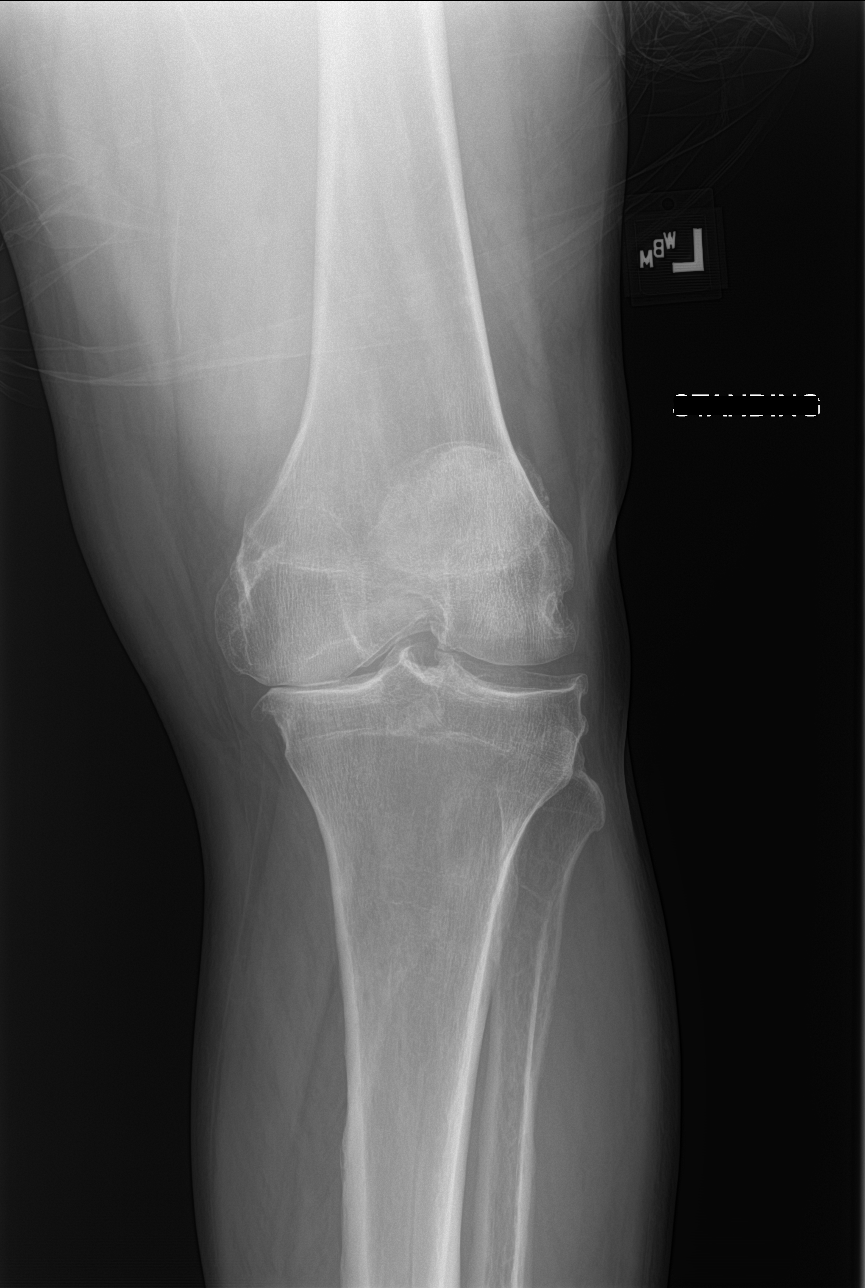

[knee lat]
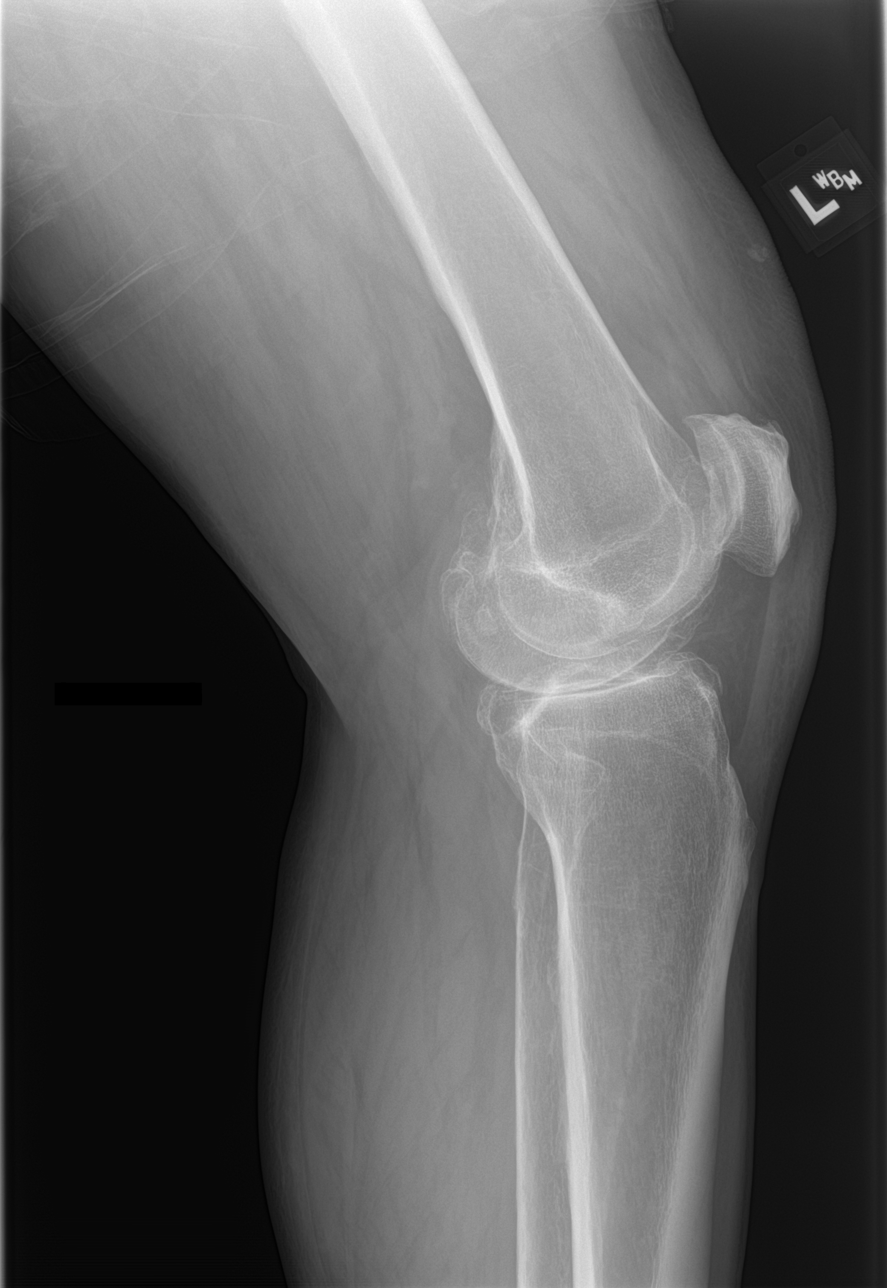

[patella skyline]
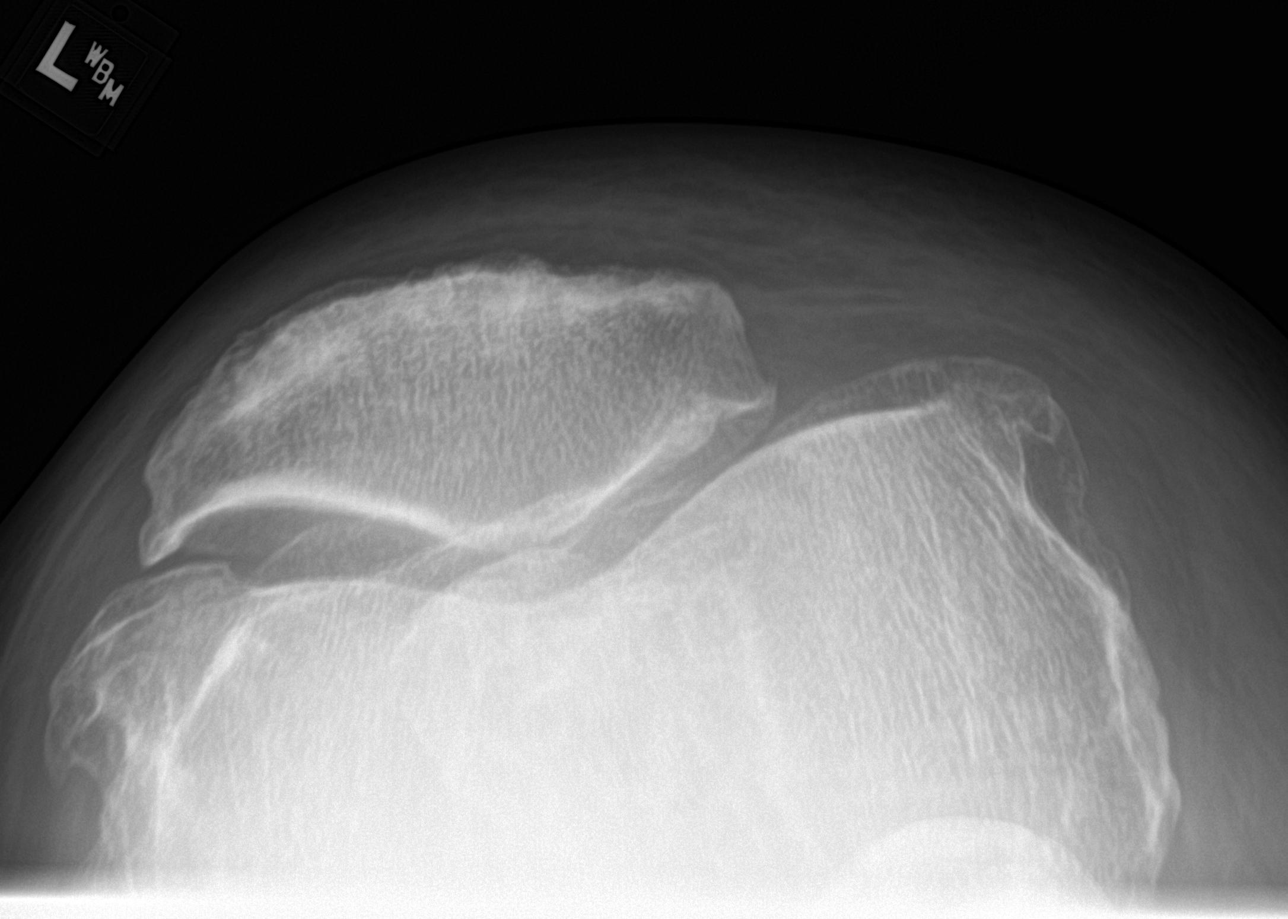

[4 of 4 positions shown; findings below may reference images not displayed]

FINDINGS: No fracture or malalignment. Moderate patellofemoral degenerative
change. Mild lateral joint space degenerative change. Advanced
medial joint space degenerative change. Trace knee effusion.
IMPRESSION: No acute osseous abnormality. Tricompartmental arthritis, most
severe involving the medial compartment. Trace knee effusion

## 2021-07-03 ENCOUNTER — Ambulatory Visit: Payer: BC Managed Care – PPO | Admitting: Cardiology

## 2021-07-18 DIAGNOSIS — G4733 Obstructive sleep apnea (adult) (pediatric): Secondary | ICD-10-CM | POA: Diagnosis not present

## 2021-07-21 ENCOUNTER — Other Ambulatory Visit: Payer: Self-pay | Admitting: Nurse Practitioner

## 2021-07-22 NOTE — Telephone Encounter (Signed)
Requested Prescriptions  Pending Prescriptions Disp Refills   rosuvastatin (CRESTOR) 10 MG tablet [Pharmacy Med Name: ROSUVASTATIN CALCIUM 10 MG TAB] 90 tablet 3    Sig: TAKE 1 TABLET BY MOUTH EVERY DAY     Cardiovascular:  Antilipid - Statins 2 Failed - 07/21/2021 10:54 AM      Failed - Lipid Panel in normal range within the last 12 months    Cholesterol, Total  Date Value Ref Range Status  02/14/2021 134 100 - 199 mg/dL Final   LDL Chol Calc (NIH)  Date Value Ref Range Status  02/14/2021 44 0 - 99 mg/dL Final   HDL  Date Value Ref Range Status  02/14/2021 56 >39 mg/dL Final   Triglycerides  Date Value Ref Range Status  02/14/2021 213 (H) 0 - 149 mg/dL Final         Passed - Cr in normal range and within 360 days    Creatinine, Ser  Date Value Ref Range Status  02/14/2021 1.03 0.76 - 1.27 mg/dL Final         Passed - Patient is not pregnant      Passed - Valid encounter within last 12 months    Recent Outpatient Visits          5 months ago Alcohol use disorder, moderate, dependence ([place])   [place], [place] T, NP   1 year ago [place], [place] T, NP   1 year ago Need for Tdap vaccination   [place], Henrine Screws T, NP   2 years ago Chronic pain of both knees   [facility] [place], [place] T, NP   2 years ago Encounter to establish care   [place], Barbaraann Faster, NP      Future Appointments            In 1 week Vickie Epley, MD [facility], [facility]

## 2021-07-31 ENCOUNTER — Ambulatory Visit: Payer: BC Managed Care – PPO | Admitting: Cardiology

## 2021-08-19 ENCOUNTER — Other Ambulatory Visit: Payer: Self-pay | Admitting: Cardiology

## 2021-09-11 ENCOUNTER — Encounter: Payer: Self-pay | Admitting: Cardiology

## 2021-09-11 ENCOUNTER — Ambulatory Visit: Payer: BC Managed Care – PPO | Admitting: Cardiology

## 2021-09-11 VITALS — BP 112/70 | HR 80 | Ht 72.0 in | Wt 281.0 lb

## 2021-09-11 DIAGNOSIS — B369 Superficial mycosis, unspecified: Secondary | ICD-10-CM

## 2021-09-11 DIAGNOSIS — E669 Obesity, unspecified: Secondary | ICD-10-CM | POA: Diagnosis not present

## 2021-09-11 DIAGNOSIS — I5022 Chronic systolic (congestive) heart failure: Secondary | ICD-10-CM | POA: Diagnosis not present

## 2021-09-11 DIAGNOSIS — Z01818 Encounter for other preprocedural examination: Secondary | ICD-10-CM

## 2021-09-11 DIAGNOSIS — I4819 Other persistent atrial fibrillation: Secondary | ICD-10-CM | POA: Diagnosis not present

## 2021-09-11 DIAGNOSIS — I4891 Unspecified atrial fibrillation: Secondary | ICD-10-CM

## 2021-09-11 NOTE — Patient Instructions (Addendum)
Medications: ?Your physician recommends that you continue on your current medications as directed. Please refer to the Current Medication list given to you today. ?*If you need a refill on your cardiac medications before your next appointment, please call your pharmacy* ? ?Lab Work: ?Orthoptist between June 26-30 at [employer] at [facility] ?1st desk on the right to check in Lab hours: 7:30 am- 5:30 pm (walk in basis)  ?If you have labs (blood work) drawn today and your tests are completely normal, you will receive your results only by: ?MyChart Message (if you have MyChart) OR ?A paper copy in the mail ?If you have any lab test that is abnormal or we need to change your treatment, we will call you to review the results. ? ?Testing/Procedures: ?Your physician has requested that you have an echocardiogram. Echocardiography is a painless test that uses sound waves to create images of your heart. It provides your doctor with information about the size and shape of your heart and how well your heart?s chambers and valves are working. This procedure takes approximately one hour. There are no restrictions for this procedure. ? ?Your physician has requested that you have cardiac CT. Cardiac computed tomography (CT) is a painless test that uses an x-ray machine to take clear, detailed pictures of your heart. For further information please visit [email or web address]. Please follow instruction sheet as given. ? ?Your physician has recommended that you have an ablation. Catheter ablation is a medical procedure used to treat some cardiac arrhythmias (irregular heartbeats). During catheter ablation, a long, thin, flexible tube is put into a blood vessel in your groin (upper thigh), or neck. This tube is called an ablation catheter. It is then guided to your heart through the blood vessel. Radio frequency waves destroy small areas of heart tissue where abnormal heartbeats may cause an arrhythmia to start. Please see the  instruction sheet given to you today. ? ? ?Follow-Up: ?At [facility], you and your health needs are our priority.  As part of our continuing mission to provide you with exceptional heart care, we have created designated Provider Care Teams.  These Care Teams include your primary Cardiologist (physician) and Advanced Practice Providers (APPs -  Physician Assistants and Nurse Practitioners) who all work together to provide you with the care you need, when you need it. ? ?Your physician wants you to follow-up in: (ablation July 20) 4 weeks post ablation with the [facility] and 3 months post ablation with Dr. Quentin Ore.  ? ?We recommend signing up for the patient portal called "MyChart".  Sign up information is provided on this After Visit Summary.  MyChart is used to connect with patients for Virtual Visits (Telemedicine).  Patients are able to view lab/test results, encounter notes, upcoming appointments, etc.  Non-urgent messages can be sent to your provider as well.   ?To learn more about what you can do with MyChart, go to [email or web address].   ? ?Any Other Special Instructions Will Be Listed Below (If Applicable). ? ?Cardiac Ablation ?Cardiac ablation is a procedure to destroy (ablate) some heart tissue that is sending bad signals. These bad signals cause problems in heart rhythm. ?The heart has many areas that make these signals. If there are problems in these areas, they can make the heart beat in a way that is not normal. Destroying some tissues can help make the heart rhythm normal. ?Tell your doctor about: ?Any allergies you have. ?All medicines you are taking. These include vitamins, herbs, eye drops, creams, and  over-the-counter medicines. ?Any problems you or family members have had with medicines that make you fall asleep (anesthetics). ?Any blood disorders you have. ?Any surgeries you have had. ?Any medical conditions you have, such as kidney failure. ?Whether you are pregnant or may be  pregnant. ?What are the risks? ?This is a safe procedure. But problems may occur, including: ?Infection. ?Bruising and bleeding. ?Bleeding into the chest. ?Stroke or blood clots. ?Damage to nearby areas of your body. ?Allergies to medicines or dyes. ?The need for a pacemaker if the normal system is damaged. ?Failure of the procedure to treat the problem. ?What happens before the procedure? ?Medicines ?Ask your doctor about: ?Changing or stopping your normal medicines. This is important. ?Taking aspirin and ibuprofen. Do not take these medicines unless your doctor tells you to take them. ?Taking other medicines, vitamins, herbs, and supplements. ?General instructions ?Follow instructions from your doctor about what you cannot eat or drink. ?Plan to have someone take you home from the hospital or clinic. ?If you will be going home right after the procedure, plan to have someone with you for 24 hours. ?Ask your doctor what steps will be taken to prevent infection. ?What happens during the procedure? ? ?An IV tube will be put into one of your veins. ?You will be given a medicine to help you relax. ?The skin on your neck or groin will be numbed. ?A cut (incision) will be made in your neck or groin. A needle will be put through your cut and into a large vein. ?A tube (catheter) will be put into the needle. The tube will be moved to your heart. ?Dye may be put through the tube. This helps your doctor see your heart. ?Small devices (electrodes) on the tube will send out signals. ?A type of energy will be used to destroy some heart tissue. ?The tube will be taken out. ?Pressure will be held on your cut. This helps stop bleeding. ?A bandage will be put over your cut. ?The exact procedure may vary among doctors and hospitals. ?What happens after the procedure? ?You will be watched until you leave the hospital or clinic. This includes checking your heart rate, breathing rate, oxygen, and blood pressure. ?Your cut will be  watched for bleeding. You will need to lie still for a few hours. ?Do not drive for 24 hours or as long as your doctor tells you. ?Summary ?Cardiac ablation is a procedure to destroy some heart tissue. This is done to treat heart rhythm problems. ?Tell your doctor about any medical conditions you may have. Tell him or her about all medicines you are taking to treat them. ?This is a safe procedure. But problems may occur. These include infection, bruising, bleeding, and damage to nearby areas of your body. ?Follow what your doctor tells you about food and drink. You may also be told to change or stop some of your medicines. ?After the procedure, do not drive for 24 hours or as long as your doctor tells you. ?This information is not intended to replace advice given to you by your health care provider. Make sure you discuss any questions you have with your health care provider. ?Document Revised: 04/28/2019 Document Reviewed: 04/28/2019 ?Elsevier Patient Education ? 2022 [place]. ? ? ?

## 2021-09-11 NOTE — Progress Notes (Signed)
?Electrophysiology Office Follow up Visit Note:   ? ?Date:  09/11/2021  ? ?ID:  Joseph [name], DOB [birth year], MRN [id number] ? ?PCP:  Venita Lick, NP  ?[facility] Cardiologist:  None  ?[facility] Electrophysiologist:  Vickie Epley, MD  ? ? ?Interval History:   ? ?Joseph [name] is a 61 y.o. male who presents for a follow up visit.  ?He was previously scheduled for an atrial fibrillation ablation but this was canceled because of concern for inguinal rash. ?He presents today to discuss atrial fibrillation ablation again.  He has been doing well since I last saw him.  He has not had a recurrence of his atrial fibrillation but is still very motivated to get off amiodarone long-term.  He has been taking Eliquis twice daily for stroke prevention without any bleeding issues. ? ? ?  ? ?Past Medical History:  ?Diagnosis Date  ? Atrial fibrillation ([place])   ? Hernia, hiatal   ? 20 years ago  ? Tachycardia   ? ? ?Past Surgical History:  ?Procedure Laterality Date  ? CARDIOVERSION N/A 07/10/2020  ? Procedure: CARDIOVERSION;  Surgeon: Nelva Bush, MD;  Location: [facility] ORS;  Service: Cardiovascular;  Laterality: N/A;  ? CARDIOVERSION N/A 07/30/2020  ? Procedure: CARDIOVERSION;  Surgeon: Wellington Hampshire, MD;  Location: [facility] ORS;  Service: Cardiovascular;  Laterality: N/A;  ? CARDIOVERSION N/A 09/14/2020  ? Procedure: CARDIOVERSION;  Surgeon: Wellington Hampshire, MD;  Location: [facility] ORS;  Service: Cardiovascular;  Laterality: N/A;  ? COLONOSCOPY WITH PROPOFOL N/A 12/02/2019  ? Procedure: COLONOSCOPY WITH PROPOFOL;  Surgeon: Jonathon Bellows, MD;  Location: [facility] ENDOSCOPY;  Service: Gastroenterology;  Laterality: N/A;  ? HIATAL HERNIA REPAIR    ? TEE WITHOUT CARDIOVERSION N/A 07/10/2020  ? Procedure: TEE with Cardioversion;  Surgeon: Nelva Bush, MD;  Location: [facility] ORS;  Service: Cardiovascular;  Laterality: N/A;  ? ? ?Current Medications: ?Current Meds  ?Medication Sig  ? acetaminophen (TYLENOL) 500 MG  tablet Take 1,000 mg by mouth every 6 (six) hours as needed for mild pain or moderate pain.  ? amiodarone (PACERONE) 200 MG tablet TAKE 1 TABLET BY MOUTH EVERY DAY  ? apixaban (ELIQUIS) 5 MG TABS tablet Take 1 tablet (5 mg total) by mouth 2 (two) times daily.  ? gabapentin (NEURONTIN) 100 MG capsule Take 100 mg by mouth 3 (three) times daily.  ? loratadine (CLARITIN) 10 MG tablet Take 10 mg by mouth daily.  ? losartan (COZAAR) 25 MG tablet Take 1 tablet (25 mg total) by mouth daily.  ? metoprolol succinate (TOPROL-XL) 50 MG 24 hr tablet TAKE 1 TABLET BY MOUTH EVERY DAY  ? naltrexone (DEPADE) 50 MG tablet Start out with 50 MG (one tablet) by mouth once daily for one week and then increase to 100 MG (two tablets) by mouth once daily.  ? naproxen sodium (ALEVE) 220 MG tablet Take 220 mg by mouth as needed.  ? nystatin (MYCOSTATIN/NYSTOP) powder Apply 1 application topically 3 (three) times daily.  ? rosuvastatin (CRESTOR) 10 MG tablet TAKE 1 TABLET BY MOUTH EVERY DAY  ?  ? ?Allergies:   Patient has no known allergies.  ? ?Social History  ? ?Socioeconomic History  ? Marital status: Married  ?  Spouse name: Not on file  ? Number of children: Not on file  ? Years of education: Not on file  ? Highest education level: Not on file  ?Occupational History  ?  Comment: [employer]  ?Tobacco Use  ? Smoking  status: Never  ? Smokeless tobacco: Current  ?  Types: Chew  ?Vaping Use  ? Vaping Use: Never used  ?Substance and Sexual Activity  ? Alcohol use: Yes  ?  Comment: 12 pack on the weekend  ? Drug use: Never  ? Sexual activity: Yes  ?Other Topics Concern  ? Not on file  ?Social History Narrative  ? Caffeine use: 2 diet cokes at lunch, coffee sometimes in the morning  ? Right handed  ? ?Social Determinants of Health  ? ?Financial Resource Strain: Not on file  ?Food Insecurity: Not on file  ?Transportation Needs: Not on file  ?Physical Activity: Not on file  ?Stress: Not on file  ?Social Connections: Not on file  ?  ? ?Family  History: ?The patient's family history includes COPD in his sister; Leukemia in his father. ? ?ROS:   ?Please see the history of present illness.    ?All other systems reviewed and are negative. ? ?EKGs/Labs/Other Studies Reviewed:   ? ?The following studies were reviewed today: ? ? ?EKG:  The ekg ordered today demonstrates sinus rhythm. ? ?Recent Labs: ?02/14/2021: ALT 48; BUN 12; Creatinine, Ser 1.03; Hemoglobin 14.8; Magnesium 1.7; Platelets 110; Potassium 3.6; Sodium 139; TSH 4.230  ?Recent Lipid Panel ?   ?Component Value Date/Time  ? CHOL 134 02/14/2021 0845  ? TRIG 213 (H) 02/14/2021 0845  ? HDL 56 02/14/2021 0845  ? CHOLHDL 3.3 08/12/2019 0837  ? [place] 44 02/14/2021 0845  ? ? ?Physical Exam:   ? ?VS:  BP 112/70 (BP Location: Left Arm, Patient Position: Sitting, Cuff Size: Large)   Pulse 80   Ht 6' (1.829 m)   Wt 281 lb (127.5 kg)   SpO2 94%   BMI 38.11 kg/m?    ? ?Wt Readings from Last 3 Encounters:  ?09/11/21 281 lb (127.5 kg)  ?02/20/21 286 lb (129.7 kg)  ?02/14/21 281 lb 9.6 oz (127.7 kg)  ?  ? ?GEN:  Well nourished, well developed in no acute distress.  Obese ?HEENT: Normal ?NECK: No JVD; No carotid bruits ?LYMPHATICS: No lymphadenopathy ?CARDIAC: RRR, no murmurs, rubs, gallops ?RESPIRATORY:  Clear to auscultation without rales, wheezing or rhonchi  ?ABDOMEN: Soft, non-tender, non-distended ?MUSCULOSKELETAL:  No edema; No deformity  ?SKIN: Warm and dry ?NEUROLOGIC:  Alert and oriented x 3 ?PSYCHIATRIC:  Normal affect  ? ? ? ?  ? ?ASSESSMENT:   ? ?1. Persistent atrial fibrillation ([place])   ?2. Chronic systolic heart failure ([place])   ?3. Obesity (BMI 30-39.9)   ?4. Fungal rash of torso   ? ?PLAN:   ? ?In order of problems listed above: ? ? ?#Persistent atrial fibrillation ?Maintaining sinus rhythm on amiodarone.  On Eliquis for stroke prophylaxis.  Discussed ablation again with the patient during today's visit and he is very interested in proceeding.  I discussed the catheter ablation procedure in  detail include the risk, recovery and likelihood of success.  I discussed the potential need for second ablation procedures or alternative antiarrhythmic drugs after an initial ablation procedure.  He is interested in proceeding. ? ?Risk, benefits, and alternatives to EP study and radiofrequency ablation for afib were also discussed in detail today. These risks include but are not limited to stroke, bleeding, vascular damage, tamponade, perforation, damage to the esophagus, lungs, and other structures, pulmonary vein stenosis, worsening renal function, and death. The patient understands these risk and wishes to proceed.  We will therefore proceed with catheter ablation at the next available time.  [place]o,  ICE, anesthesia are requested for the procedure.  Will also obtain CT PV protocol prior to the procedure to exclude LAA thrombus and further evaluate atrial anatomy. ? ? ?#Chronic systolic heart failure ?NYHA class II.  Last ejection fraction 30 to 35% in April 2022. ?We will repeat an echocardiogram prior to the procedure to reassess his left ventricular function now that he has maintain normal rhythm. ? ?Continue current medical therapy including Toprol, losartan.  Rhythm control indicated. ? ?#Fungal rash ?Improving/resolved. ? ? ? ?Medication Adjustments/Labs and Tests Ordered: ?Current medicines are reviewed at length with the patient today.  Concerns regarding medicines are outlined above.  ?No orders of the defined types were placed in this encounter. ? ?No orders of the defined types were placed in this encounter. ? ? ? ?Signed, ?Lars Mage, MD, [facility], [place] ?09/11/2021 3:02 PM    ?Electrophysiology ?[place] ?

## 2021-09-13 ENCOUNTER — Other Ambulatory Visit: Payer: BC Managed Care – PPO

## 2021-09-14 DIAGNOSIS — I5022 Chronic systolic (congestive) heart failure: Secondary | ICD-10-CM | POA: Insufficient documentation

## 2021-09-14 DIAGNOSIS — D696 Thrombocytopenia, unspecified: Secondary | ICD-10-CM | POA: Insufficient documentation

## 2021-09-14 NOTE — Patient Instructions (Signed)
Heart Failure Eating Plan Heart failure, also called congestive heart failure, occurs when your heart does not pump blood well enough to meet your body's needs for oxygen-rich blood. Heart failure is a long-term (chronic) condition. Living with heart failure can be challenging. Following your health care provider's instructions about a healthy lifestyle and working with a dietitian to choose the right foods may help to improve your symptoms. An eating plan for someone with heart failure will include changes that limit the intake of salt (sodium) and unhealthy fat. What are tips for following this plan? Reading food labels Check food labels for the amount of sodium per serving. Choose foods that have less than 140 mg (milligrams) of sodium in each serving. Check food labels for the number of calories per serving. This is important if you need to limit your daily calorie intake to lose weight. Check food labels for the serving size. If you eat more than one serving, you will be eating more sodium and calories than what is listed on the label. Look for foods that are labeled as "sodium-free," "very low sodium," or "low sodium." Foods labeled as "reduced sodium" or "lightly salted" may still have more sodium than what is recommended for you. Cooking Avoid adding salt when cooking. Ask your health care provider or dietitian before using salt substitutes. Season food with salt-free seasonings, spices, or herbs. Check the label of seasoning mixes to make sure they do not contain salt. Cook with heart-healthy oils, such as olive, canola, soybean, or sunflower oil. Do not fry foods. Cook foods using low-fat methods, such as baking, boiling, grilling, and broiling. Limit unhealthy fats when cooking by: Removing the skin from poultry, such as chicken. Removing all visible fats from meats. Skimming the fat off from stews, soups, and gravies before serving them. Meal planning  Limit your intake  of: Processed, canned, or prepackaged foods. Foods that are high in trans fat, such as fried foods. Sweets, desserts, sugary drinks, and other foods with added sugar. Full-fat dairy products, such as whole milk. Eat a balanced diet. This may include: 4-5 servings of fruit each day and 4-5 servings of vegetables each day. At each meal, try to fill one-half of your plate with fruits and vegetables. Up to 6-8 servings of whole grains each day. Up to 2 servings of lean meat, poultry, or fish each day. One serving of meat is equal to 3 oz (85 g). This is about the same size as a deck of cards. 2 servings of low-fat dairy each day. Heart-healthy fats. Healthy fats called omega-3 fatty acids are found in foods such as flaxseed and cold-water fish like sardines, salmon, and mackerel. Aim to eat 25-35 g (grams) of fiber a day. Foods that are high in fiber include apples, broccoli, carrots, beans, peas, and whole grains. Do not add salt or condiments that contain salt (such as soy sauce) to foods before eating. When eating at a restaurant, ask that your food be prepared with less salt or no salt, if possible. Try to eat 2 or more vegetarian meals each week. Eat more home-cooked food and eat less restaurant, buffet, and fast food. General information Do not eat more than 2,300 mg of sodium a day. The amount of sodium that is recommended for you may be lower, depending on your condition. Maintain a healthy body weight as directed. Ask your health care provider what a healthy weight is for you. Check your weight every day. Work with your health care provider   and dietitian to make a plan that is right for you to lose weight or maintain your current weight. Limit how much fluid you drink. Ask your health care provider or dietitian how much fluid you can have each day. Limit or avoid alcohol as told by your health care provider or dietitian. Recommended foods Fruits All fresh, frozen, and canned fruits.  Dried fruits, such as raisins, prunes, and cranberries. Vegetables All fresh vegetables. Vegetables that are frozen without sauce or added salt. Low-sodium or sodium-free canned vegetables. Grains Bread with less than 80 mg of sodium per slice. Whole-wheat pasta, quinoa, and brown rice. Oats and oatmeal. Barley. Millet. Grits and cream of wheat. Whole-grain and whole-wheat cold cereal. Meats and other protein foods Lean cuts of meat. Skinless chicken and turkey. Fish with high omega-3 fatty acids, such as salmon, sardines, and other cold-water fishes. Eggs. Dried beans, peas, and edamame. Unsalted nuts and nut butters. Dairy Low-fat or nonfat (skim) milk and dried milk. Rice milk, soy milk, and almond milk. Low-fat or nonfat yogurt. Small amounts of reduced-sodium block cheese. Low-sodium cottage cheese. Fats and oils Olive, canola, soybean, flaxseed, avocado, or sunflower oil. Sweets and desserts Applesauce. Granola bars. Sugar-free pudding and gelatin. Frozen fruit bars. Seasoning and other foods Fresh and dried herbs. Lemon or lime juice. Vinegar. Low-sodium ketchup. Salt-free marinades, salad dressings, sauces, and seasonings. The items listed above may not be a complete list of foods and beverages you can eat. Contact a dietitian for more information. Foods to avoid Fruits Fruits that are dried with sodium-containing preservatives. Vegetables Canned vegetables. Frozen vegetables with sauce or seasonings. Creamed vegetables. French fries. Onion rings. Pickled vegetables and sauerkraut. Grains Bread with more than 80 mg of sodium per slice. Hot or cold cereal with more than 140 mg sodium per serving. Salted pretzels and crackers. Prepackaged breadcrumbs. Bagels, croissants, and biscuits. Meats and other protein foods Ribs and chicken wings. Bacon, ham, pepperoni, bologna, salami, and packaged luncheon meats. Hot dogs, bratwurst, and sausage. Canned meat. Smoked meat and fish. Salted nuts  and seeds. Dairy Whole milk, half-and-half, and cream. Buttermilk. Processed cheese, cheese spreads, and cheese curds. Regular cottage cheese. Feta cheese. Shredded cheese. String cheese. Fats and oils Butter, lard, shortening, ghee, and bacon fat. Canned and packaged gravies. Seasoning and other foods Onion salt, garlic salt, table salt, and sea salt. Marinades. Regular salad dressings. Relishes, pickles, and olives. Meat flavorings and tenderizers, and bouillon cubes. Horseradish, ketchup, and mustard. Worcestershire sauce. Teriyaki sauce, soy sauce (including reduced sodium). Hot sauce and Tabasco sauce. Steak sauce, fish sauce, oyster sauce, and cocktail sauce. Taco seasonings. Barbecue sauce. Tartar sauce. The items listed above may not be a complete list of foods and beverages you should avoid. Contact a dietitian for more information. Summary A heart failure eating plan includes changes that limit your intake of sodium and unhealthy fat, and it may help you lose weight or maintain a healthy weight. Your health care provider may also recommend limiting how much fluid you drink. Most people with heart failure should eat no more than 2,300 mg of salt (sodium) a day. The amount of sodium that is recommended for you may be lower, depending on your condition. Contact your health care provider or dietitian before making any major changes to your diet. This information is not intended to replace advice given to you by your health care provider. Make sure you discuss any questions you have with your health care provider. Document Revised: 01/09/2020 Document Reviewed: 01/09/2020   Elsevier Patient Education  2022 [employer]  

## 2021-09-16 ENCOUNTER — Other Ambulatory Visit: Payer: Self-pay | Admitting: Cardiology

## 2021-09-19 ENCOUNTER — Ambulatory Visit: Payer: BC Managed Care – PPO | Admitting: Nurse Practitioner

## 2021-09-19 ENCOUNTER — Encounter: Payer: Self-pay | Admitting: Nurse Practitioner

## 2021-09-19 VITALS — BP 116/83 | HR 90 | Temp 98.1°F | Ht 72.01 in | Wt 275.0 lb

## 2021-09-19 DIAGNOSIS — B356 Tinea cruris: Secondary | ICD-10-CM

## 2021-09-19 DIAGNOSIS — I5022 Chronic systolic (congestive) heart failure: Secondary | ICD-10-CM

## 2021-09-19 DIAGNOSIS — G4733 Obstructive sleep apnea (adult) (pediatric): Secondary | ICD-10-CM

## 2021-09-19 DIAGNOSIS — G8929 Other chronic pain: Secondary | ICD-10-CM

## 2021-09-19 DIAGNOSIS — I480 Paroxysmal atrial fibrillation: Secondary | ICD-10-CM | POA: Diagnosis not present

## 2021-09-19 DIAGNOSIS — F1020 Alcohol dependence, uncomplicated: Secondary | ICD-10-CM | POA: Diagnosis not present

## 2021-09-19 DIAGNOSIS — D696 Thrombocytopenia, unspecified: Secondary | ICD-10-CM | POA: Diagnosis not present

## 2021-09-19 DIAGNOSIS — D6869 Other thrombophilia: Secondary | ICD-10-CM | POA: Diagnosis not present

## 2021-09-19 DIAGNOSIS — I77819 Aortic ectasia, unspecified site: Secondary | ICD-10-CM | POA: Diagnosis not present

## 2021-09-19 DIAGNOSIS — M25561 Pain in right knee: Secondary | ICD-10-CM

## 2021-09-19 DIAGNOSIS — M25562 Pain in left knee: Secondary | ICD-10-CM

## 2021-09-19 DIAGNOSIS — R7989 Other specified abnormal findings of blood chemistry: Secondary | ICD-10-CM

## 2021-09-19 DIAGNOSIS — N400 Enlarged prostate without lower urinary tract symptoms: Secondary | ICD-10-CM | POA: Diagnosis not present

## 2021-09-19 DIAGNOSIS — R030 Elevated blood-pressure reading, without diagnosis of hypertension: Secondary | ICD-10-CM

## 2021-09-19 MED ORDER — CICLOPIROX 0.77 % EX GEL: 1.0000 "application " | Freq: Two times a day (BID) | CUTANEOUS | 4 refills | Status: DC

## 2021-09-19 NOTE — Assessment & Plan Note (Signed)
Ongoing.  Steroid injection to right knee performed and given strict instructions on post injection care and when to go to ER.  Recommend continued simple treatment at home with OTC medications and creams + knee brace.  Also would benefit from ice as needed.  Return to office in 4 weeks for left knee injection. ?

## 2021-09-19 NOTE — Assessment & Plan Note (Signed)
Chronic, ongoing noted on echo 09/21/20.  Euvolemic.  Continue collaboration with cardiology and current medication regimen as ordered by them.  Recommend: ?- Reminded to call for an overnight weight gain of >2 pounds or a weekly weight gain of >5 pounds ?- not adding salt to food and read food labels. Reviewed the importance of keeping daily sodium intake to '2000mg'$  daily. ?- Avoid Ibuprofen products. ?

## 2021-09-19 NOTE — Assessment & Plan Note (Signed)
Noted on recent labs, recheck CBC today. ?

## 2021-09-19 NOTE — Progress Notes (Signed)
? ?BP 116/83   Pulse 90 Comment: apical  Temp 98.1 ?F (36.7 ?C) (Oral)   Ht 6' 0.01" (1.829 m)   Wt 275 lb (124.7 kg)   SpO2 95%   BMI 37.29 kg/m?   ? ?Subjective:  ? ? Patient ID: Joseph [name], male    DOB: [birth year], 61 y.o.   MRN: [id number] ? ?HPI: ?Joseph [name] is a 61 y.o. male ? ?Chief Complaint  ?Patient presents with  ? Knee Pain  ?  B/L knee pain right > left. Would like knee injection  ? Referral  ?  Patient would like referral  to Dermatology for [facility], started over a year ago. OTC meds are just not working  ? ?ATRIAL FIBRILLATION ?Has had 3 cardioversions total.  Followed by cardiology, last saw 09/11/21-- scheduled for ablation 12/26/21.  Taking Amiodarone, Eliquis, Losartan, Metoprolol, Crestor. ? ?Currently not drinking any alcohol daily.  Not currently going to [place]. Cut back one week ago, prior to this was drinking 4-5 shots of liquor a day.   ?Atrial fibrillation status: stable ?Satisfied with current treatment: yes  ?Medication side effects:  no ?Medication compliance: good compliance ?Etiology of atrial fibrillation:  ?Palpitations:   occasional ?Chest pain:  no ?Dyspnea on exertion:  no ?Orthopnea:  no ?Syncope:  no ?Edema:  no ?Ventricular rate control: B-blocker ?Anti-coagulation: long acting  ? ? ?  09/19/2021  ?  8:49 AM 07/02/2020  ?  9:40 AM 08/12/2019  ?  8:13 AM 06/27/2019  ?  9:07 AM 06/27/2019  ?  8:26 AM  ?Depression screen PHQ 2/9  ?Decreased Interest 2 0 0 0 0  ?Down, Depressed, Hopeless 2 0 0 0 0  ?PHQ - 2 Score 4 0 0 0 0  ?Altered sleeping 1   0 1  ?Tired, decreased energy 2   0 0  ?Change in appetite 1   0 0  ?Feeling bad or failure about yourself  2   0 0  ?Trouble concentrating 0   0 0  ?Moving slowly or fidgety/restless 0   0 0  ?Suicidal thoughts 0   0 0  ?PHQ-9 Score 10   0 1  ?Difficult doing work/chores Very difficult    Not difficult at all  ?  ?KNEE PAIN ?Both knees currently are causing pain -- right knee worse then left.  Would like knee injection, has  had one before in January 2021 to right knee. ?Duration: chronic ?Involved knee: bilateral ?Mechanism of injury: unknown ?Location:anterior ?Onset: gradual ?Severity: 6/10  ?Quality:  sharp, dull, aching, and throbbing ?Frequency: intermittent ?Radiation: no ?Aggravating factors: weight bearing, walking, bending, and movement  ?Alleviating factors: nothing  ?Status: fluctuating ?Treatments attempted: ice and Aleve  ?Relief with NSAIDs?:  mild ?Weakness with weight bearing or walking: no ?Sensation of giving way: no ?Locking: no ?Popping: yes ?Bruising: no ?Swelling: occasional ?Redness: no ?Paresthesias/decreased sensation: no ?Fevers: no  ? ?TINEA CRURIS ?Was treated with Nystatin powder which initially worked, it improved but never went away. ?Duration: months ?Location: groin ?Redness: yes ?Swelling: no ?Oozing: no ?Pus: no ?Fevers: no ?Nausea/vomiting: no ?Status: fluctuating ?Treatments attempted: Nystatin  ?  ?Relevant past medical, surgical, family and social history reviewed and updated as indicated. Interim medical history since our last visit reviewed. ?Allergies and medications reviewed and updated. ? ?Review of Systems  ?Constitutional:  Negative for activity change, diaphoresis, fatigue and fever.  ?Respiratory:  Negative for cough, chest tightness, shortness of breath and wheezing.   ?Cardiovascular:  Negative for chest pain, palpitations and leg swelling.  ?Gastrointestinal: Negative.   ?Musculoskeletal:  Positive for arthralgias.  ?Neurological: Negative.   ?Psychiatric/Behavioral: Negative.    ? ?Per HPI unless specifically indicated above ? ?   ?Objective:  ?  ?BP 116/83   Pulse 90 Comment: apical  Temp 98.1 ?F (36.7 ?C) (Oral)   Ht 6' 0.01" (1.829 m)   Wt 275 lb (124.7 kg)   SpO2 95%   BMI 37.29 kg/m?   ?Wt Readings from Last 3 Encounters:  ?09/19/21 275 lb (124.7 kg)  ?09/11/21 281 lb (127.5 kg)  ?02/20/21 286 lb (129.7 kg)  ?  ?Physical Exam ?Vitals and nursing note reviewed.   ?Constitutional:   ?   General: He is awake. He is not in acute distress. ?   Appearance: He is well-developed and well-groomed. He is obese. He is not ill-appearing.  ?HENT:  ?   Head: Normocephalic and atraumatic.  ?   Right Ear: Hearing normal. No drainage.  ?   Left Ear: Hearing normal. No drainage.  ?Eyes:  ?   General: Lids are normal.     ?   Right eye: No discharge.     ?   Left eye: No discharge.  ?   Conjunctiva/sclera: Conjunctivae normal.  ?   Pupils: Pupils are equal, round, and reactive to light.  ?Neck:  ?   Thyroid: No thyromegaly.  ?   Vascular: No carotid bruit.  ?   Trachea: Trachea normal.  ?Cardiovascular:  ?   Rate and Rhythm: Normal rate. Rhythm irregularly irregular.  ?   Heart sounds: Normal heart sounds, S1 normal and S2 normal. No murmur heard. ?  No gallop.  ?Pulmonary:  ?   Effort: Pulmonary effort is normal. No accessory muscle usage or respiratory distress.  ?   Breath sounds: Normal breath sounds.  ?Abdominal:  ?   General: Bowel sounds are normal.  ?   Palpations: Abdomen is soft. There is no hepatomegaly or splenomegaly.  ?Musculoskeletal:  ?   Cervical back: Normal range of motion and neck supple.  ?   Right knee: Crepitus present. No swelling, ecchymosis, lacerations or bony tenderness. Decreased range of motion. Tenderness present over the patellar tendon.  ?   Instability Tests: Medial McMurray test negative and lateral McMurray test negative.  ?   Left knee: Crepitus present. No swelling, ecchymosis, lacerations or bony tenderness. Decreased range of motion. Tenderness present over the patellar tendon.  ?   Instability Tests: Medial McMurray test negative and lateral McMurray test negative.  ?   Right lower leg: No edema.  ?   Left lower leg: No edema.  ?Skin: ?   General: Skin is warm and dry.  ?   Capillary Refill: Capillary refill takes less than 2 seconds.  ?   Findings: Rash present.  ?   Comments: Groin area with mild erythema to upper thigh.  ?Neurological:  ?   Mental  Status: He is alert and oriented to person, place, and time.  ?   Deep Tendon Reflexes: Reflexes are normal and symmetric.  ?Psychiatric:     ?   Attention and Perception: Attention normal.     ?   Mood and Affect: Mood normal.     ?   Speech: Speech normal.     ?   Behavior: Behavior normal. Behavior is cooperative.     ?   Thought Content: Thought content normal.  ? ?STEROID INJECTION (RIGHT) ?Procedure: Knee  Intraarticular Steroid Injection  ?Description: After verbal consent and patient education on procedure area prepped and draped using semi-sterile technique. Using a anterior  approach, a mixture of 4 cc of  1% Marcaine & 1 cc of Kenalog 40 was injected into right knee joint.  A bandage was then placed over the injection site. Complications:  none ?Post Procedure Instructions: To the ER if any symptoms of erythema or swelling.   ?Follow Up: PRN  ? ?Results for orders placed or performed in visit on 02/14/21  ?Comprehensive metabolic panel  ?Result Value Ref Range  ? Glucose 100 (H) 65 - 99 mg/dL  ? BUN 12 8 - 27 mg/dL  ? Creatinine, Ser 1.03 0.76 - 1.27 mg/dL  ? eGFR 83 >59 mL/min/1.73  ? BUN/Creatinine Ratio 12 10 - 24  ? Sodium 139 134 - 144 mmol/L  ? Potassium 3.6 3.5 - 5.2 mmol/L  ? Chloride 102 96 - 106 mmol/L  ? CO2 21 20 - 29 mmol/L  ? Calcium 9.4 8.6 - 10.2 mg/dL  ? Total Protein 6.6 6.0 - 8.5 g/dL  ? Albumin 4.4 3.8 - 4.9 g/dL  ? Globulin, Total 2.2 1.5 - 4.5 g/dL  ? Albumin/Globulin Ratio 2.0 1.2 - 2.2  ? Bilirubin Total 1.1 0.0 - 1.2 mg/dL  ? Alkaline Phosphatase 66 44 - 121 IU/L  ? AST 54 (H) 0 - 40 IU/L  ? ALT 48 (H) 0 - 44 IU/L  ?Lipid Panel w/o Chol/HDL Ratio  ?Result Value Ref Range  ? Cholesterol, Total 134 100 - 199 mg/dL  ? Triglycerides 213 (H) 0 - 149 mg/dL  ? HDL 56 >39 mg/dL  ? VLDL Cholesterol Cal 34 5 - 40 mg/dL  ? LDL Chol Calc (NIH) 44 0 - 99 mg/dL  ?TSH  ?Result Value Ref Range  ? TSH 4.230 0.450 - 4.500 uIU/mL  ?CBC with Differential/Platelet  ?Result Value Ref Range  ? WBC 6.3  3.4 - 10.8 x10E3/uL  ? RBC 4.15 4.14 - 5.80 x10E6/uL  ? Hemoglobin 14.8 13.0 - 17.7 g/dL  ? Hematocrit 42.9 37.5 - 51.0 %  ? MCV 103 (H) 79 - 97 fL  ? MCH 35.7 (H) 26.6 - 33.0 pg  ? MCHC 34.5 31.5 - 35.7 g/dL  ? RDW 12.5 11.6 - 1

## 2021-09-19 NOTE — Assessment & Plan Note (Signed)
Chronic, ongoing, followed by cardiology.  Continue current medication regimen as ordered by them and collaboration, recent notes and labs reviewed.  This may be exacerbated by his recent heavier alcohol use, which he is aware to continue to reduce.  ? ?

## 2021-09-19 NOTE — Assessment & Plan Note (Signed)
Tinea present.  Scripts sent for alternate gel.  Recommend keeping area clean and dry as much as possible to avoid recurrent flares.  Referral to dermatology per request. ?

## 2021-09-19 NOTE — Assessment & Plan Note (Signed)
Elevated iron on recent labs, was drinking heavier.  Recheck today. ?

## 2021-09-19 NOTE — Assessment & Plan Note (Signed)
Continue collaboration with cardiology, noted on imaging 09/21/20. ?

## 2021-09-19 NOTE — Assessment & Plan Note (Signed)
Over past 6 months has been drinking heavier, but reports cut back recently.  Continue Naltrexone daily.  Recheck labs today.  Recommend AA , which he has attended in past with benefit.   ?

## 2021-09-19 NOTE — Assessment & Plan Note (Signed)
BMI 37.29 with A-Fib and OSA.  Recommended eating smaller high protein, low fat meals more frequently and exercising 30 mins a day 5 times a week with a goal of 10-15lb weight loss in the next 3 months. Patient voiced their understanding and motivation to adhere to these recommendations. ? ?

## 2021-09-19 NOTE — Assessment & Plan Note (Signed)
On Eliquis and has A-Fib.  Continue to monitor closely for increased bruising or bleeding.  CBC annually. ?

## 2021-09-20 ENCOUNTER — Other Ambulatory Visit: Payer: Self-pay | Admitting: Nurse Practitioner

## 2021-09-20 DIAGNOSIS — F1020 Alcohol dependence, uncomplicated: Secondary | ICD-10-CM

## 2021-09-20 DIAGNOSIS — R7989 Other specified abnormal findings of blood chemistry: Secondary | ICD-10-CM

## 2021-09-20 LAB — CBC WITH DIFFERENTIAL/PLATELET
Basophils Absolute: 0.1 10*3/uL (ref 0.0–0.2)
Basos: 1 %
EOS (ABSOLUTE): 0.3 10*3/uL (ref 0.0–0.4)
Eos: 4 %
Hematocrit: 43.6 % (ref 37.5–51.0)
Hemoglobin: 15.5 g/dL (ref 13.0–17.7)
Immature Grans (Abs): 0 10*3/uL (ref 0.0–0.1)
Immature Granulocytes: 0 %
Lymphocytes Absolute: 1.6 10*3/uL (ref 0.7–3.1)
Lymphs: 25 %
MCH: 36.9 pg — ABNORMAL HIGH (ref 26.6–33.0)
MCHC: 35.6 g/dL (ref 31.5–35.7)
MCV: 104 fL — ABNORMAL HIGH (ref 79–97)
Monocytes Absolute: 0.4 10*3/uL (ref 0.1–0.9)
Monocytes: 7 %
Neutrophils Absolute: 3.9 10*3/uL (ref 1.4–7.0)
Neutrophils: 63 %
Platelets: 125 10*3/uL — ABNORMAL LOW (ref 150–450)
RBC: 4.2 x10E6/uL (ref 4.14–5.80)
RDW: 12.6 % (ref 11.6–15.4)
WBC: 6.2 10*3/uL (ref 3.4–10.8)

## 2021-09-20 LAB — COMPREHENSIVE METABOLIC PANEL
ALT: 58 IU/L — ABNORMAL HIGH (ref 0–44)
AST: 92 IU/L — ABNORMAL HIGH (ref 0–40)
Albumin/Globulin Ratio: 2.1 (ref 1.2–2.2)
Albumin: 4.8 g/dL (ref 3.8–4.8)
Alkaline Phosphatase: 75 IU/L (ref 44–121)
BUN/Creatinine Ratio: 7 — ABNORMAL LOW (ref 10–24)
BUN: 10 mg/dL (ref 8–27)
Bilirubin Total: 1.9 mg/dL — ABNORMAL HIGH (ref 0.0–1.2)
CO2: 23 mmol/L (ref 20–29)
Calcium: 9.3 mg/dL (ref 8.6–10.2)
Chloride: 98 mmol/L (ref 96–106)
Creatinine, Ser: 1.43 mg/dL — ABNORMAL HIGH (ref 0.76–1.27)
Globulin, Total: 2.3 g/dL (ref 1.5–4.5)
Glucose: 93 mg/dL (ref 70–99)
Potassium: 3.4 mmol/L — ABNORMAL LOW (ref 3.5–5.2)
Sodium: 138 mmol/L (ref 134–144)
Total Protein: 7.1 g/dL (ref 6.0–8.5)
eGFR: 56 mL/min/{1.73_m2} — ABNORMAL LOW (ref >59)

## 2021-09-20 LAB — LIPID PANEL W/O CHOL/HDL RATIO
Cholesterol, Total: 135 mg/dL (ref 100–199)
HDL: 49 mg/dL (ref >39)
LDL Chol Calc (NIH): 47 mg/dL (ref 0–99)
Triglycerides: 249 mg/dL — ABNORMAL HIGH (ref 0–149)
VLDL Cholesterol Cal: 39 mg/dL (ref 5–40)

## 2021-09-20 LAB — IRON AND TIBC
Iron Saturation: 83 % (ref 15–55)
Iron: 250 ug/dL — ABNORMAL HIGH (ref 38–169)
Total Iron Binding Capacity: 300 ug/dL (ref 250–450)
UIBC: 50 ug/dL — ABNORMAL LOW (ref 111–343)

## 2021-09-20 LAB — PSA: Prostate Specific Ag, Serum: 0.4 ng/mL (ref 0.0–4.0)

## 2021-09-20 LAB — FERRITIN: Ferritin: 2813 ng/mL — ABNORMAL HIGH (ref 30–400)

## 2021-09-20 LAB — TSH: TSH: 4.94 u[IU]/mL — ABNORMAL HIGH (ref 0.450–4.500)

## 2021-09-20 NOTE — Progress Notes (Signed)
Needs lab only visit in 6 weeks please

## 2021-09-20 NOTE — Addendum Note (Signed)
Addended by: Marnee Guarneri T on: 09/20/2021 11:59 AM ? ? Modules accepted: Orders ? ?

## 2021-09-20 NOTE — Progress Notes (Signed)
Contacted via [place] -- please reach out to patient and ensure he receive below message: ?Good afternoon Mr. Milanes, labs have returned and I have some concerns which will discuss here: ?- Iron level remains elevated and ferritin level remains very elevated -- it has trended up.  I suspect this may be related to your alcohol use, but I would like to get you into hematology for further assessment and am also going to order an ultrasound of your liver to further assess.  Please cut back on alcohol intake.   ?- Liver function tests remain elevated, have trended up a little this check.  We will obtain ultrasound and look at liver. ?- Platelet levels, what helps to clot blood, remain mildly low.  We will continue to monitor these. ?- Kidney function has trended down this check, showing some mild kidney disease.  Please add more water to regimen daily + some potassium rich foods like bananas, dried fruit due to slightly low potassium on check. ?- Cholesterol levels are at goal and prostate level is normal. ?- Thyroid lab is mildly elevated this check, showing some hypothyroid (sluggish thyroid) which we can see at times with patients who are on Amiodarone.  At this time I would like you to return in 6 weeks for lab only visit to recheck a few of these labs.  Please listen out for calls from hematology for appointment and also to schedule ultrasound.  Any questions? ?Keep being amazing!!  Thank you for allowing me to participate in your care.  I appreciate you. ?Kindest regards, ?Rajan Burgard ?

## 2021-09-24 ENCOUNTER — Encounter: Payer: Self-pay | Admitting: *Deleted

## 2021-09-25 ENCOUNTER — Other Ambulatory Visit: Payer: Self-pay | Admitting: Cardiology

## 2021-09-26 ENCOUNTER — Telehealth: Payer: Self-pay | Admitting: *Deleted

## 2021-09-26 NOTE — Telephone Encounter (Signed)
Left message to call back and go over procedure instructions.  ?

## 2021-09-27 ENCOUNTER — Ambulatory Visit
Admission: RE | Admit: 2021-09-27 | Discharge: 2021-09-27 | Disposition: A | Discharge status: Home / Self Care | Payer: BC Managed Care – PPO | Source: Ambulatory Visit | Attending: Nurse Practitioner | Admitting: Nurse Practitioner

## 2021-09-27 DIAGNOSIS — R7989 Other specified abnormal findings of blood chemistry: Secondary | ICD-10-CM | POA: Insufficient documentation

## 2021-09-27 DIAGNOSIS — F1020 Alcohol dependence, uncomplicated: Secondary | ICD-10-CM | POA: Diagnosis not present

## 2021-09-27 DIAGNOSIS — R945 Abnormal results of liver function studies: Secondary | ICD-10-CM | POA: Diagnosis not present

## 2021-09-29 NOTE — Progress Notes (Signed)
Contacted via [place] ? ? ?Good morning Joseph [name], your ultrasound has returned.  There is noted to be some fatty liver disease (hepatic steatosis) present.  No lesions noted, good news!!  For fatty liver disease the goal is to prevent worsening of this.  I recommend cutting back on alcohol and working towards completed cessation.  No Tylenol use.  Please ensure to return for repeat labs -- do you have this scheduled?  Focus on healthy diet changes too for liver health.  Any questions? ?Keep being amazing!!  Thank you for allowing me to participate in your care.  I appreciate you. ?Kindest regards, ?Aleksandar Duve ?

## 2021-10-01 NOTE — Telephone Encounter (Signed)
Left message to call back  

## 2021-10-10 NOTE — Addendum Note (Signed)
Addended by: Darrell Jewel on: 10/10/2021 04:25 PM ? ? Modules accepted: Orders ? ?

## 2021-10-22 ENCOUNTER — Encounter: Payer: Self-pay | Admitting: Nurse Practitioner

## 2021-10-27 NOTE — Patient Instructions (Incomplete)

## 2021-10-28 ENCOUNTER — Other Ambulatory Visit: Payer: Self-pay | Admitting: Pharmacist

## 2021-10-28 MED ORDER — APIXABAN 5 MG PO TABS: 5.0000 mg | ORAL_TABLET | Freq: Two times a day (BID) | ORAL | 1 refills | Status: DC

## 2021-10-28 NOTE — Telephone Encounter (Signed)
Prescription refill request for Eliquis received. Indication:afib Last office visit:09/11/21 Scr:1.43 Age: 61 Weight:124

## 2021-11-01 ENCOUNTER — Ambulatory Visit: Payer: BC Managed Care – PPO | Admitting: Nurse Practitioner

## 2021-11-01 DIAGNOSIS — I480 Paroxysmal atrial fibrillation: Secondary | ICD-10-CM

## 2021-11-01 DIAGNOSIS — R7989 Other specified abnormal findings of blood chemistry: Secondary | ICD-10-CM

## 2021-11-01 DIAGNOSIS — I5022 Chronic systolic (congestive) heart failure: Secondary | ICD-10-CM

## 2021-11-01 DIAGNOSIS — G8929 Other chronic pain: Secondary | ICD-10-CM

## 2021-11-01 DIAGNOSIS — D696 Thrombocytopenia, unspecified: Secondary | ICD-10-CM

## 2021-11-07 ENCOUNTER — Encounter: Payer: Self-pay | Admitting: Cardiology

## 2021-11-07 NOTE — Telephone Encounter (Signed)
Left message to call back  

## 2021-11-07 NOTE — Progress Notes (Signed)
Unable to contact patient to schedule echocardiogram, letter sent, order cancelled. 

## 2021-11-11 ENCOUNTER — Encounter: Payer: Self-pay | Admitting: Nurse Practitioner

## 2021-11-11 ENCOUNTER — Ambulatory Visit
Admission: RE | Admit: 2021-11-11 | Discharge: 2021-11-11 | Disposition: A | Discharge status: Home / Self Care | Payer: BC Managed Care – PPO | Source: Ambulatory Visit | Attending: Emergency Medicine | Admitting: Emergency Medicine

## 2021-11-11 ENCOUNTER — Telehealth: Payer: Self-pay | Admitting: Emergency Medicine

## 2021-11-11 ENCOUNTER — Ambulatory Visit (INDEPENDENT_AMBULATORY_CARE_PROVIDER_SITE_OTHER): Payer: BC Managed Care – PPO

## 2021-11-11 ENCOUNTER — Other Ambulatory Visit: Payer: BC Managed Care – PPO

## 2021-11-11 ENCOUNTER — Other Ambulatory Visit: Payer: Self-pay | Admitting: *Deleted

## 2021-11-11 VITALS — BP 100/72 | HR 67 | Temp 98.1°F | Resp 16

## 2021-11-11 DIAGNOSIS — W19XXXA Unspecified fall, initial encounter: Secondary | ICD-10-CM | POA: Diagnosis not present

## 2021-11-11 DIAGNOSIS — I480 Paroxysmal atrial fibrillation: Secondary | ICD-10-CM

## 2021-11-11 DIAGNOSIS — Z01812 Encounter for preprocedural laboratory examination: Secondary | ICD-10-CM

## 2021-11-11 DIAGNOSIS — S42291A Other displaced fracture of upper end of right humerus, initial encounter for closed fracture: Secondary | ICD-10-CM

## 2021-11-11 DIAGNOSIS — M25511 Pain in right shoulder: Secondary | ICD-10-CM

## 2021-11-11 MED ORDER — KETOROLAC TROMETHAMINE 30 MG/ML IJ SOLN
30.0000 mg | Freq: Once | INTRAMUSCULAR | Status: AC
  Administered 2021-11-11: 30 mg via INTRAMUSCULAR

## 2021-11-11 MED ORDER — OXYCODONE HCL 5 MG PO TABS: 5.0000 mg | ORAL_TABLET | ORAL | 0 refills | Status: DC | PRN

## 2021-11-11 MED ORDER — HYDROCODONE-ACETAMINOPHEN 5-325 MG PO TABS: 1.0000 | ORAL_TABLET | Freq: Four times a day (QID) | ORAL | 0 refills | Status: DC | PRN

## 2021-11-11 NOTE — Telephone Encounter (Signed)
Pt aware of test date for ablation  and has instructions for ablation and ct. Per pt no questions at this time Encouraged if does have questions to call back and is also aware to get labs prior to ablation ./cy

## 2021-11-11 NOTE — Telephone Encounter (Signed)
Patient notified urgent care that pharmacies are currently out of stock of oxycodone, Percocet sent for use instead, PDMP reviewed, low risk

## 2021-11-11 NOTE — Discharge Instructions (Signed)
Your x-ray today showed a fracture ( break in bone) of head of your humerus and it is out of the joint space  You may use oxycodone every 6 hours for management of pain, be mindful of this medication may make you drowsy, you may use Tylenol and Aleve in addition  You have been placed in a sling to add stability and support to your arm, please wear as much as possible to prevent further movement of the break and to reduce your pain, you may remove for bathing and sleeping  Follow up with orthopedic specialist by the end of the week.  Call practice to make appointment. Information listed below. You may go to any orthopedic provider you deem fit.  Please do not put weight on fracture.

## 2021-11-11 NOTE — ED Triage Notes (Signed)
Pt missed a step on his porch yesterday and landed on his right shoulder into the grass. He denies hitting his head or loss of consciousness.He did bite his bottom lip.

## 2021-11-11 NOTE — ED Provider Notes (Signed)
Joseph [name]    CSN: [id number] Arrival date & time: 11/11/21  1157      History   Chief Complaint Chief Complaint  Patient presents with   Fall    I may have dislocated my shoulder. Regardless, it is extremely swollen preventing full motion of my right arm without intense pain. - Entered by patient    HPI Joseph [name] is a 61 y.o. male.   Patient presents with right shoulder pain and swelling beginning 1 day ago after fall.  Endorses that he missed a step on his porch, landing on his right side and biting his lip.  Denies hitting head or loss of consciousness.  Range of motion is intact but elicits severe pain.  He believes the area to be dislocated and therefore last night had daughter pulled on her arm in an attempt to reset which was ineffective.  Denies numbness or tingling.  Has attempted use of Aleve for pain management.  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   Thrombocytopenia ([place]) 09/14/2021   Alcohol use disorder, moderate, dependence ([place]) 02/14/2021   OSA (obstructive sleep apnea) 02/09/2021   Morbid obesity ([employer]) 10/30/2020   Aortic dilatation ([place]) 09/26/2020   Other thrombophilia ([place]) 07/04/2020   Atrial fibrillation ([place]) 07/02/2020   Subcutaneous mass of both feet 06/27/2019   Elevated BP without diagnosis of hypertension 06/27/2019   Tinea cruris 06/27/2019   Chronic pain of both knees 06/27/2019    Past Surgical History:  Procedure Laterality Date   CARDIOVERSION N/A 07/10/2020   Procedure: CARDIOVERSION;  Surgeon: Nelva Bush, MD;  Location: [facility] ORS;  Service: Cardiovascular;  Laterality: N/A;    CARDIOVERSION N/A 07/30/2020   Procedure: CARDIOVERSION;  Surgeon: Wellington Hampshire, MD;  Location: [facility];  Service: Cardiovascular;  Laterality: N/A;   CARDIOVERSION N/A 09/14/2020   Procedure: CARDIOVERSION;  Surgeon: Wellington Hampshire, MD;  Location: [facility] ORS;  Service: Cardiovascular;  Laterality: N/A;   COLONOSCOPY WITH PROPOFOL N/A 12/02/2019   Procedure: COLONOSCOPY WITH PROPOFOL;  Surgeon: Jonathon Bellows, MD;  Location: [facility] ENDOSCOPY;  Service: Gastroenterology;  Laterality: N/A;   HIATAL HERNIA REPAIR     TEE WITHOUT CARDIOVERSION N/A 07/10/2020   Procedure: TEE with Cardioversion;  Surgeon: Nelva Bush, MD;  Location: [facility] ORS;  Service: Cardiovascular;  Laterality: N/A;       Home Medications    Prior to Admission medications   Medication Sig Start Date End Date Taking? Authorizing Provider  oxyCODONE (ROXICODONE) 5 MG immediate release tablet Take 1 tablet (5 mg total) by mouth every 4 (four) hours as needed for severe pain. 11/11/21  Yes Dereke Neumann, Leitha Schuller, NP  acetaminophen (TYLENOL) 500 MG tablet Take 1,000 mg by mouth every 6 (six) hours as needed for mild pain or moderate pain.    [provider]  amiodarone (PACERONE) 200 MG tablet TAKE 1 TABLET BY MOUTH EVERY DAY 09/25/21   Vickie Epley, MD  apixaban (ELIQUIS) 5 MG TABS tablet Take 1 tablet (5 mg total) by mouth 2 (two) times daily. 10/28/21   Vickie Epley, MD  Ciclopirox 0.77 % gel  Apply 1 application. topically 2 (two) times daily. 09/19/21   Cannady, Henrine Screws T, NP  gabapentin (NEURONTIN) 100 MG capsule Take 100 mg by mouth 3 (three) times daily.    [provider]  loratadine (CLARITIN) 10 MG tablet Take 10 mg by mouth daily.    [provider]  losartan (COZAAR) 25 MG tablet TAKE 1 TABLET (25 MG TOTAL) BY MOUTH DAILY. 09/17/21 12/16/21  Vickie Epley, MD  metoprolol succinate (TOPROL-XL) 50 MG 24 hr tablet TAKE 1 TABLET BY MOUTH EVERY DAY 08/19/21   Vickie Epley, MD   naltrexone (DEPADE) 50 MG tablet Start out with 50 MG (one tablet) by mouth once daily for one week and then increase to 100 MG (two tablets) by mouth once daily. 02/14/21   Cannady, Henrine Screws T, NP  naproxen sodium (ALEVE) 220 MG tablet Take 220 mg by mouth as needed.    [provider]  rosuvastatin (CRESTOR) 10 MG tablet TAKE 1 TABLET BY MOUTH EVERY DAY 07/22/21   Venita Lick, NP    Family History Family History  Problem Relation Age of Onset   Leukemia Father    COPD Sister     Social History Social History   Tobacco Use   Smoking status: Never   Smokeless tobacco: Current    Types: Chew  Vaping Use   Vaping Use: Never used  Substance Use Topics   Alcohol use: Yes    Comment: 12 pack on the weekend   Drug use: Never     Allergies   Patient has no known allergies.   Review of Systems Review of Systems  Constitutional: Negative.   Respiratory: Negative.    Musculoskeletal:  Positive for joint swelling. Negative for arthralgias, back pain, gait problem, myalgias, neck pain and neck stiffness.  Skin: Negative.   Neurological: Negative.     Physical Exam Triage Vital Signs ED Triage Vitals  Enc Vitals Group     BP 11/11/21 1229 100/72     Pulse Rate 11/11/21 1229 67     Resp 11/11/21 1229 16     Temp 11/11/21 1229 98.1 F (36.7 C)     Temp Source 11/11/21 1229 Oral     SpO2 11/11/21 1229 95 %     Weight --      Height --      Head Circumference --      Peak Flow --      Pain Score 11/11/21 1228 8     Pain Loc --      Pain Edu? --      Excl. in [place]? --    No data found.  Updated Vital Signs BP 100/72 (BP Location: Left Arm)   Pulse 67   Temp 98.1 F (36.7 C) (Oral)   Resp 16   SpO2 95%   Visual Acuity Right Eye Distance:   Left Eye Distance:   Bilateral Distance:    Right Eye Near:   Left Eye Near:    Bilateral Near:     Physical Exam Constitutional:      Appearance: Normal appearance.  HENT:     Head: Normocephalic.  Eyes:      Extraocular Movements: Extraocular movements intact.  Pulmonary:     Effort: Pulmonary effort is normal.  Musculoskeletal:     Comments: Tenderness along the posterior aspect of the right shoulder with point tenderness over the humerus, moderate to severe swelling present along the lateral aspect of the right shoulder, unable to  complete range of motion, no ecchymosis or overt deformity noted  Skin:    General: Skin is warm and dry.  Neurological:     Mental Status: He is alert and oriented to person, place, and time. Mental status is at baseline.  Psychiatric:        Mood and Affect: Mood normal.        Behavior: Behavior normal.     UC Treatments / Results  Labs (all labs ordered are listed, but only abnormal results are displayed) Labs Reviewed - No data to display  EKG   Radiology DG Shoulder Right  Result Date: 11/11/2021 CLINICAL DATA:  Fall, pain, decreased range of motion EXAM: RIGHT SHOULDER - 2+ VIEW COMPARISON:  None Available. FINDINGS: There is an acute comminuted fracture of the right proximal humerus surgical neck extending into the humeral head. Diffuse shoulder region soft tissue swelling. There is some degree of inferior subluxation of the fracture right humeral head in relation to the glenoid. Scapular Y-view is limited. Included right chest unremarkable. IMPRESSION: Acute comminuted and displaced right proximal humerus surgical neck/head fracture with at least some degree of inferior subluxation in relation to the glenoid. Electronically Signed   By: Jerilynn Mages.  Shick M.D.   On: 11/11/2021 12:37    Procedures Procedures (including critical care time)  Medications Ordered in UC Medications  ketorolac (TORADOL) 30 MG/ML injection 30 mg (30 mg Intramuscular Given 11/11/21 1256)    Initial Impression / Assessment and Plan / UC Course  I have reviewed the triage vital signs and the nursing notes.  Pertinent labs & imaging results that were available during my care of  the patient were reviewed by me and considered in my medical decision making (see chart for details).  Humeral head fracture, right, closed, initial encounter Fall, initial encounter  Fracture confirmed via x-ray, displacement present, discussed with patient, completed telephone consult with Dr. Harlow Mares who is on-call for [facility] due to displacement, recommended sling and follow-up by the end of the week, recommended sling to be on whenever awake, may remove for bathing and sleep, oxycodone prescribed for pain, PDMP reviewed, low risk, nonweightbearing precautions given, given the walking referral to [facility] for further evaluation and management, patient endorses that he is already an established patient due to his knees Final Clinical Impressions(s) / UC Diagnoses   Final diagnoses:  Fall, initial encounter  Humeral head fracture, right, closed, initial encounter     Discharge Instructions      Your x-ray today showed a fracture ( break in bone) of head of your humerus and it is out of the joint space  You may use oxycodone every 6 hours for management of pain, be mindful of this medication may make you drowsy, you may use Tylenol and Aleve in addition  You have been placed in a sling to add stability and support to your arm, please wear as much as possible to prevent further movement of the break and to reduce your pain, you may remove for bathing and sleeping  Follow up with orthopedic specialist by the end of the week.  Call practice to make appointment. Information listed below. You may go to any orthopedic provider you deem fit.  Please do not put weight on fracture.      ED Prescriptions     Medication Sig Dispense Auth. Provider   oxyCODONE (ROXICODONE) 5 MG immediate release tablet Take 1 tablet (5 mg total) by mouth every 4 (four) hours as needed for severe pain.  30 tablet Harshith Pursell, Leitha Schuller, NP      I have reviewed the PDMP during this encounter.   Hans Eden, NP 11/11/21 1346

## 2021-11-13 DIAGNOSIS — S42201A Unspecified fracture of upper end of right humerus, initial encounter for closed fracture: Secondary | ICD-10-CM | POA: Diagnosis not present

## 2021-12-03 DIAGNOSIS — S42201A Unspecified fracture of upper end of right humerus, initial encounter for closed fracture: Secondary | ICD-10-CM | POA: Diagnosis not present

## 2021-12-06 DIAGNOSIS — S42201D Unspecified fracture of upper end of right humerus, subsequent encounter for fracture with routine healing: Secondary | ICD-10-CM | POA: Diagnosis not present

## 2021-12-09 ENCOUNTER — Telehealth: Payer: Self-pay | Admitting: Cardiology

## 2021-12-09 NOTE — Telephone Encounter (Signed)
Per patient: plan with Ortho is watch and wait, if heals great, if not, may need surgery.    Canceled CT scan, ablation, and post op appts. Advised patient to call back when he has more information as he heals. Verbalized understanding and agreement.   Will check with MD Quentin Ore for follow up timing.

## 2021-12-09 NOTE — Telephone Encounter (Signed)
Patient has ablation schedule on 7/20 he has to postponed because he broke his shoulder.  It is very difficult for him to lie down.

## 2021-12-17 DIAGNOSIS — S42201D Unspecified fracture of upper end of right humerus, subsequent encounter for fracture with routine healing: Secondary | ICD-10-CM | POA: Diagnosis not present

## 2021-12-18 ENCOUNTER — Other Ambulatory Visit: Payer: Self-pay | Admitting: Cardiology

## 2021-12-23 ENCOUNTER — Other Ambulatory Visit: Payer: BC Managed Care – PPO

## 2021-12-23 ENCOUNTER — Inpatient Hospital Stay: Admission: RE | Admit: 2021-12-23 | Payer: BC Managed Care – PPO | Source: Ambulatory Visit

## 2021-12-26 ENCOUNTER — Ambulatory Visit (HOSPITAL_COMMUNITY): Admit: 2021-12-26 | Payer: BC Managed Care – PPO | Admitting: Cardiology

## 2021-12-26 ENCOUNTER — Encounter (HOSPITAL_COMMUNITY): Payer: Self-pay

## 2021-12-26 SURGERY — ATRIAL FIBRILLATION ABLATION
Anesthesia: General

## 2021-12-28 ENCOUNTER — Other Ambulatory Visit: Payer: Self-pay | Admitting: Cardiology

## 2022-01-07 DIAGNOSIS — S42201D Unspecified fracture of upper end of right humerus, subsequent encounter for fracture with routine healing: Secondary | ICD-10-CM | POA: Diagnosis not present

## 2022-01-14 DIAGNOSIS — S42201D Unspecified fracture of upper end of right humerus, subsequent encounter for fracture with routine healing: Secondary | ICD-10-CM | POA: Diagnosis not present

## 2022-01-22 DIAGNOSIS — H524 Presbyopia: Secondary | ICD-10-CM | POA: Diagnosis not present

## 2022-01-22 DIAGNOSIS — H259 Unspecified age-related cataract: Secondary | ICD-10-CM | POA: Diagnosis not present

## 2022-01-22 DIAGNOSIS — H5213 Myopia, bilateral: Secondary | ICD-10-CM | POA: Diagnosis not present

## 2022-01-23 ENCOUNTER — Ambulatory Visit (HOSPITAL_COMMUNITY): Payer: BC Managed Care – PPO | Admitting: Physician Assistant

## 2022-01-27 DIAGNOSIS — S42201D Unspecified fracture of upper end of right humerus, subsequent encounter for fracture with routine healing: Secondary | ICD-10-CM | POA: Diagnosis not present

## 2022-01-28 ENCOUNTER — Other Ambulatory Visit: Payer: Self-pay | Admitting: Cardiology

## 2022-01-28 DIAGNOSIS — S42201D Unspecified fracture of upper end of right humerus, subsequent encounter for fracture with routine healing: Secondary | ICD-10-CM | POA: Diagnosis not present

## 2022-01-28 DIAGNOSIS — Z01818 Encounter for other preprocedural examination: Secondary | ICD-10-CM | POA: Diagnosis not present

## 2022-01-28 DIAGNOSIS — H25811 Combined forms of age-related cataract, right eye: Secondary | ICD-10-CM | POA: Diagnosis not present

## 2022-01-28 NOTE — Telephone Encounter (Signed)
Please schedule F/U appt with Dr. Quentin Ore. Thank you!

## 2022-01-31 NOTE — Telephone Encounter (Signed)
Lvm to schedule appt with Dr. Quentin Ore

## 2022-02-11 ENCOUNTER — Other Ambulatory Visit: Payer: Self-pay | Admitting: Cardiology

## 2022-02-12 NOTE — Telephone Encounter (Signed)
Please contact pt for future appointment. Pt due for f/u with Dr. Quentin Ore.

## 2022-02-27 DIAGNOSIS — H25811 Combined forms of age-related cataract, right eye: Secondary | ICD-10-CM | POA: Diagnosis not present

## 2022-03-20 DIAGNOSIS — H269 Unspecified cataract: Secondary | ICD-10-CM | POA: Diagnosis not present

## 2022-03-20 DIAGNOSIS — G4733 Obstructive sleep apnea (adult) (pediatric): Secondary | ICD-10-CM | POA: Diagnosis not present

## 2022-03-20 DIAGNOSIS — H25812 Combined forms of age-related cataract, left eye: Secondary | ICD-10-CM | POA: Diagnosis not present

## 2022-03-21 NOTE — Telephone Encounter (Signed)
Lvm to schedule

## 2022-04-02 ENCOUNTER — Other Ambulatory Visit: Payer: Self-pay | Admitting: Cardiology

## 2022-04-02 ENCOUNTER — Ambulatory Visit: Payer: BC Managed Care – PPO | Admitting: Cardiology

## 2022-04-02 NOTE — Telephone Encounter (Signed)
Please schedule overdue F/U appointment.

## 2022-04-03 ENCOUNTER — Other Ambulatory Visit: Payer: Self-pay | Admitting: Cardiology

## 2022-04-03 NOTE — Telephone Encounter (Signed)
Please contact pt for future appointment. Pt overdue for f/u. Pt needing refills. 

## 2022-04-04 DIAGNOSIS — S42201D Unspecified fracture of upper end of right humerus, subsequent encounter for fracture with routine healing: Secondary | ICD-10-CM | POA: Diagnosis not present

## 2022-04-11 NOTE — Telephone Encounter (Signed)
Lvm to schedule past due f/u

## 2022-05-02 ENCOUNTER — Other Ambulatory Visit: Payer: Self-pay | Admitting: Nurse Practitioner

## 2022-05-02 NOTE — Telephone Encounter (Signed)
Requested medication (s) are due for refill today - provider review   Requested medication (s) are on the active medication list -yes  Future visit scheduled -no  Last refill: 09/19/21 45g 4RF  Notes to clinic: medication not assigned protocol- provider review   Requested Prescriptions  Pending Prescriptions Disp Refills   Ciclopirox 0.77 % gel [Pharmacy Med Name: CICLOPIROX 0.77% GEL] 45 g 1    Sig: Apply 1 application. topically 2 (two) times daily.     Off-Protocol Failed - 05/02/2022  2:12 AM      Failed - Medication not assigned to a protocol, review manually.      Passed - Valid encounter within last 12 months    Recent Outpatient Visits           7 months ago CHF (congestive heart failure), NYHA class II, chronic, systolic ([place])   [place], Harbine T, NP   1 year ago Alcohol use disorder, moderate, dependence ([place])   [place], [place]umway T, NP   1 year ago [place], Henrine Screws T, NP   2 years ago Need for Tdap vaccination   [place], Henrine Screws T, NP   2 years ago Chronic pain of both knees   [place], Lamar T, NP                 Requested Prescriptions  Pending Prescriptions Disp Refills   Ciclopirox 0.77 % gel [Pharmacy Med Name: CICLOPIROX 0.77% GEL] 45 g 1    Sig: Apply 1 application. topically 2 (two) times daily.     Off-Protocol Failed - 05/02/2022  2:12 AM      Failed - Medication not assigned to a protocol, review manually.      Passed - Valid encounter within last 12 months    Recent Outpatient Visits           7 months ago CHF (congestive heart failure), NYHA class II, chronic, systolic ([place])   [place], [place] T, NP   1 year ago Alcohol use disorder, moderate, dependence ([place])   [place], [place] T, NP   1 year ago [place], Henrine Screws T,  NP   2 years ago Need for Tdap vaccination   [place], Henrine Screws T, NP   2 years ago Chronic pain of both knees   [facility] [place], Barbaraann Faster, NP

## 2022-05-08 ENCOUNTER — Telehealth: Payer: Self-pay | Admitting: Cardiology

## 2022-05-08 NOTE — Telephone Encounter (Signed)
Pt calling because he is ready to r/s his ablation

## 2022-05-19 ENCOUNTER — Other Ambulatory Visit: Payer: Self-pay | Admitting: Cardiology

## 2022-05-19 NOTE — Telephone Encounter (Signed)
Prescription refill request for Eliquis received. Indication:afib Last office visit:4/23 Scr:1.4 Age: 61 Weight:124.7  kg  Prescription refilled

## 2022-05-21 NOTE — Telephone Encounter (Signed)
LM for pt to call back to reschedule Afib Ablation

## 2022-05-23 NOTE — Telephone Encounter (Signed)
2nd attempt to call pt back to schedule procedure. LM for pt to call back.

## 2022-05-30 ENCOUNTER — Other Ambulatory Visit: Payer: Self-pay

## 2022-05-30 DIAGNOSIS — I480 Paroxysmal atrial fibrillation: Secondary | ICD-10-CM

## 2022-05-30 MED ORDER — AMIODARONE HCL 200 MG PO TABS: 200.0000 mg | ORAL_TABLET | Freq: Every day | ORAL | 3 refills | Status: DC

## 2022-05-30 NOTE — Telephone Encounter (Signed)
Patient returning call.

## 2022-05-30 NOTE — Telephone Encounter (Signed)
Pt has been scheduled for 08/25/22. He was last seen by Dr. Quentin Ore in [place] 2023 so I scheduled him a f/u appt prior to procedure.  Labs can be done the day of his o/v. I have sent instruction letters via [place].

## 2022-06-01 ENCOUNTER — Other Ambulatory Visit: Payer: Self-pay | Admitting: Nurse Practitioner

## 2022-06-20 DIAGNOSIS — M1711 Unilateral primary osteoarthritis, right knee: Secondary | ICD-10-CM | POA: Diagnosis not present

## 2022-06-20 DIAGNOSIS — S42201D Unspecified fracture of upper end of right humerus, subsequent encounter for fracture with routine healing: Secondary | ICD-10-CM | POA: Diagnosis not present

## 2022-07-25 ENCOUNTER — Other Ambulatory Visit (HOSPITAL_COMMUNITY): Payer: BC Managed Care – PPO

## 2022-08-04 NOTE — Progress Notes (Deleted)
  Electrophysiology Office Follow up Visit Note:    Date:  08/04/2022   ID:  Joseph [name], DOB 27-Jan-1961, MRN [id number]  PCP:  Venita Lick, NP  [facility] Cardiologist:  None  [facility] Electrophysiologist:  Vickie Epley, MD    Interval History:    Joseph [name] is a 62 y.o. male who presents for a follow up visit.   I last saw him in April 2023.  He was originally scheduled for catheter ablation for his atrial fibrillation but this had to be postponed because he fractured his shoulder.  He is now scheduled for ablation August 25, 2022.  As of my last appointment with him in April 2023 he was on amiodarone. He also has a history of chronic systolic heart failure with an ejection fraction of 30 to 35%.      Past medical, surgical, social and family history were reviewed.  ROS:   Please see the history of present illness.    All other systems reviewed and are negative.  EKGs/Labs/Other Studies Reviewed:    The following studies were reviewed today:  ***  EKG:  The ekg ordered today demonstrates ***   Physical Exam:    VS:  There were no vitals taken for this visit.    Wt Readings from Last 3 Encounters:  09/19/21 275 lb (124.7 kg)  09/11/21 281 lb (127.5 kg)  02/20/21 286 lb (129.7 kg)     GEN: *** Well nourished, well developed in no acute distress CARDIAC: ***RRR, no murmurs, rubs, gallops RESPIRATORY:  Clear to auscultation without rales, wheezing or rhonchi       ASSESSMENT:    1. Persistent atrial fibrillation ([place])   2. Chronic systolic heart failure ([place])   3. Encounter for long-term (current) use of high-risk medication    PLAN:    In order of problems listed above:   #Persistent atrial fibrillation Symptomatic.  Associated with chronic systolic heart failure.  On amiodarone for rhythm control.  Rhythm control is indicated given the systolic heart failure.  Also would like to get him off of amiodarone long-term  given his young age.  Continue amiodarone 200 mg by mouth once daily for now Continue Eliquis 5 mg by mouth twice daily  Discussed treatment options today for their AF including antiarrhythmic drug therapy and ablation. Discussed risks, recovery and likelihood of success. Discussed potential need for repeat ablation procedures and antiarrhythmic drugs after an initial ablation. They wish to proceed with scheduling.  Risk, benefits, and alternatives to EP study and radiofrequency ablation for afib were also discussed in detail today. These risks include but are not limited to stroke, bleeding, vascular damage, tamponade, perforation, damage to the esophagus, lungs, and other structures, pulmonary vein stenosis, worsening renal function, and death. The patient understands these risk and wishes to proceed.  We will therefore proceed with catheter ablation at the next available time.  Carto, ICE, anesthesia are requested for the procedure.  Will also obtain CT PV protocol prior to the procedure to exclude LAA thrombus and further evaluate atrial anatomy.   #Chronic systolic heart failure NYHA class II. Needs repeat echo prior to catheter ablation Continue GDMT      Signed, Lars Mage, MD, [facility], [facility] 08/04/2022 9:29 PM    Electrophysiology [place]

## 2022-08-05 ENCOUNTER — Ambulatory Visit: Payer: BC Managed Care – PPO | Admitting: Cardiology

## 2022-08-05 DIAGNOSIS — I4819 Other persistent atrial fibrillation: Secondary | ICD-10-CM

## 2022-08-05 DIAGNOSIS — Z79899 Other long term (current) drug therapy: Secondary | ICD-10-CM

## 2022-08-05 DIAGNOSIS — I5022 Chronic systolic (congestive) heart failure: Secondary | ICD-10-CM

## 2022-08-05 NOTE — Progress Notes (Incomplete)
Electrophysiology Office Follow up Visit Note:    Date:  08/05/2022   ID:  [name], DOB [birth year], MRN [id number]  PCP:  Venita Lick, NP  [facility] Cardiologist:  None  [facility] Electrophysiologist:  Vickie Epley, MD    Interval History:    Edis Warrenfeltz is a 62 y.o. male who presents for a follow up visit.   I last saw him in April 2023.  He was originally scheduled for catheter ablation for his atrial fibrillation but this had to be postponed because he fractured his shoulder.  He is now scheduled for ablation August 25, 2022.  As of my last appointment with him in April 2023 he was on amiodarone. He also has a history of chronic systolic heart failure with an ejection fraction of 30 to 35%.  Today,  He denies any palpitations, chest pain, shortness of breath, or peripheral edema. No lightheadedness, headaches, syncope, orthopnea, or PND.  (+)     Past medical, surgical, social and family history were reviewed.  ROS:   Please see the history of present illness.    All other systems reviewed and are negative.  EKGs/Labs/Other Studies Reviewed:    The following studies were reviewed today:   EKG:  The ekg ordered today demonstrates ***   Physical Exam:    VS:  There were no vitals taken for this visit.    Wt Readings from Last 3 Encounters:  09/19/21 275 lb (124.7 kg)  09/11/21 281 lb (127.5 kg)  02/20/21 286 lb (129.7 kg)     GEN:  Well nourished, well developed in no acute distress CARDIAC: ***RRR, no murmurs, rubs, gallops RESPIRATORY:  Clear to auscultation without rales, wheezing or rhonchi       ASSESSMENT:    1. Persistent atrial fibrillation ([place])   2. Chronic systolic heart failure ([place])   3. Encounter for long-term (current) use of high-risk medication    PLAN:    In order of problems listed above:   #Persistent atrial fibrillation Symptomatic.  Associated with chronic systolic heart failure.  On  amiodarone for rhythm control.  Rhythm control is indicated given the systolic heart failure.  Also would like to get him off of amiodarone long-term given his young age.  Continue amiodarone 200 mg by mouth once daily for now Continue Eliquis 5 mg by mouth twice daily  Discussed treatment options today for their AF including antiarrhythmic drug therapy and ablation. Discussed risks, recovery and likelihood of success. Discussed potential need for repeat ablation procedures and antiarrhythmic drugs after an initial ablation. They wish to proceed with scheduling.  Risk, benefits, and alternatives to EP study and radiofrequency ablation for afib were also discussed in detail today. These risks include but are not limited to stroke, bleeding, vascular damage, tamponade, perforation, damage to the esophagus, lungs, and other structures, pulmonary vein stenosis, worsening renal function, and death. The patient understands these risk and wishes to proceed.  We will therefore proceed with catheter ablation at the next available time.  Carto, ICE, anesthesia are requested for the procedure.  Will also obtain CT PV protocol prior to the procedure to exclude LAA thrombus and further evaluate atrial anatomy.   #Chronic systolic heart failure NYHA class II. Needs repeat echo prior to catheter ablation Continue GDMT   ***Plan: -   I,Mathew Stumpf,acting as a scribe for Vickie Epley, MD.,have documented all relevant documentation on the behalf of Vickie Epley, MD,as directed by  [facility]  Pryor Curia, MD while in the presence of Vickie Epley, MD.  ***  Signed, Lars Mage, MD, [facility], [facility] 08/05/2022 7:47 AM    Electrophysiology [place]

## 2022-08-13 ENCOUNTER — Telehealth: Payer: Self-pay | Admitting: Cardiology

## 2022-08-13 NOTE — Telephone Encounter (Signed)
Pt called in for cancellation of his ablation on 3/18, his f/u appt w/ Dr. Quentin Ore on 12/02/2022 due to the death of his mother and that he has to fly out to [place] to take care of things for that.

## 2022-08-13 NOTE — Telephone Encounter (Signed)
Spoke with the patient and rescheduled his ablation with Dr. Quentin Ore. Will notify scheduling team of change so that other appointments can be rescheduled.

## 2022-08-14 NOTE — Telephone Encounter (Signed)
LM for pt to call back. He needs lab appt and Echo prior to procedure.

## 2022-08-14 NOTE — Telephone Encounter (Signed)
Patient is returning call. Transferred to [place], [place].

## 2022-08-14 NOTE — Telephone Encounter (Signed)
Spoke with pt regarding his Ablation on 4/1.Marland Kitchen He needs Echo prior and needs o/v for H&P, he has not been seen in 11 mths.   He refuses to come to [facility] for any of this. I called [facility] and they can't do Echo until 4/16.  He has been scheduled to see Dr. Quentin Ore in [place] on 09/03/22 and will reschedule his procedure at that time.

## 2022-08-15 ENCOUNTER — Telehealth (HOSPITAL_COMMUNITY): Payer: Self-pay | Admitting: *Deleted

## 2022-08-15 NOTE — Telephone Encounter (Signed)
Reaching out to patient to offer assistance regarding upcoming cardiac imaging study; pt verbalizes understanding of appt date/time, parking situation and where to check in, pre-test NPO status, and verified current allergies; name and call back number provided for further questions should they arise  Gordy Clement RN Navigator Cardiac Imaging [facility] [phone] office [phone] cell  Patient to take his daily medications.

## 2022-08-19 ENCOUNTER — Ambulatory Visit: Payer: BC Managed Care – PPO

## 2022-08-24 ENCOUNTER — Other Ambulatory Visit: Payer: Self-pay | Admitting: Cardiology

## 2022-08-25 ENCOUNTER — Other Ambulatory Visit (HOSPITAL_COMMUNITY): Payer: BC Managed Care – PPO

## 2022-09-01 ENCOUNTER — Ambulatory Visit: Payer: BC Managed Care – PPO

## 2022-09-02 NOTE — Progress Notes (Unsigned)
Cardiology Office Note Date:  09/02/2022  Patient ID:  Joseph [name], Joseph [name] December 28, 1960, MRN [id number] PCP:  Venita Lick, NP  Cardiologist:  None Electrophysiologist: Vickie Epley, MD  ***refresh   Chief Complaint: ***  History of Present Illness: Joseph [name] is a 62 y.o. male with PMH notable for parox Afib, HFrEF, ETOH abuse; seen today for Vickie Epley, MD for routine electrophysiology followup.  He last saw Dr. Quentin Ore 09/2021 to discuss AF ablation again, had previously been scheduled for procedure but had to cancel d/t concern with inguinal rash. He subsequently had to reschedule again d/t shoulder surgery. In the interim, he has been maintained on amiodarone   *** amio labs *** AF burden, symptoms *** bleeding concerns *** fluid status, edema, exercise capacity    Since last being seen in our clinic the patient reports doing ***.  he denies chest pain, palpitations, dyspnea, PND, orthopnea, nausea, vomiting, dizziness, syncope, edema, weight gain, or early satiety.    AAD History: ***  Past Medical History:  Diagnosis Date   Atrial fibrillation ([place])    Hernia, hiatal    20 years ago   Tachycardia     Past Surgical History:  Procedure Laterality Date   CARDIOVERSION N/A 07/10/2020   Procedure: CARDIOVERSION;  Surgeon: Nelva Bush, MD;  Location: [facility] ORS;  Service: Cardiovascular;  Laterality: N/A;   CARDIOVERSION N/A 07/30/2020   Procedure: CARDIOVERSION;  Surgeon: Wellington Hampshire, MD;  Location: [place] ORS;  Service: Cardiovascular;  Laterality: N/A;   CARDIOVERSION N/A 09/14/2020   Procedure: CARDIOVERSION;  Surgeon: Wellington Hampshire, MD;  Location: [facility] ORS;  Service: Cardiovascular;  Laterality: N/A;   COLONOSCOPY WITH PROPOFOL N/A 12/02/2019   Procedure: COLONOSCOPY WITH PROPOFOL;  Surgeon: Jonathon Bellows, MD;  Location: [facility] ENDOSCOPY;  Service: Gastroenterology;  Laterality: N/A;   HIATAL HERNIA REPAIR     TEE WITHOUT  CARDIOVERSION N/A 07/10/2020   Procedure: TEE with Cardioversion;  Surgeon: Nelva Bush, MD;  Location: [facility] ORS;  Service: Cardiovascular;  Laterality: N/A;    Current Outpatient Medications  Medication Instructions   acetaminophen (TYLENOL) 1,000 mg, Oral, Every 6 hours PRN   amiodarone (PACERONE) 200 mg, Oral, Daily   Ciclopirox 123XX123 % gel 1 application , Topical, 2 times daily   Eliquis 5 mg, Oral, 2 times daily   gabapentin (NEURONTIN) 100 mg, Oral, 3 times daily   HYDROcodone-acetaminophen (NORCO/VICODIN) 5-325 MG tablet 1 tablet, Oral, Every 6 hours PRN   loratadine (CLARITIN) 10 mg, Oral, Daily   losartan (COZAAR) 25 mg, Oral, Daily   metoprolol succinate (TOPROL-XL) 50 mg, Oral, Daily   naltrexone (DEPADE) 50 MG tablet Start out with 50 MG (one tablet) by mouth once daily for one week and then increase to 100 MG (two tablets) by mouth once daily.   naproxen sodium (ALEVE) 220 mg, Oral, As needed   rosuvastatin (CRESTOR) 10 MG tablet TAKE 1 TABLET BY MOUTH EVERY DAY      Social History:  The patient  reports that he has never smoked. His smokeless tobacco use includes chew. He reports current alcohol use. He reports that he does not use drugs.   Family History:  *** include only if pertinent The patient's family history includes COPD in his sister; Leukemia in his father.***  ROS:  Please see the history of present illness. All other systems are reviewed and otherwise negative.   PHYSICAL EXAM: *** VS:  There were no vitals taken for this visit. BMI:  There is no height or weight on file to calculate BMI.  GEN- The patient is well appearing, alert and oriented x 3 today.   HEENT: normocephalic, atraumatic; sclera clear, conjunctiva pink; hearing intact; oropharynx clear; neck supple, no JVP Lungs- Clear to ausculation bilaterally, normal work of breathing.  No wheezes, rales, rhonchi Heart- {Blank single:19197::"Regular","Irregularly irregular"} rate and rhythm, no  murmurs, rubs or gallops, PMI not laterally displaced GI- soft, non-tender, non-distended, bowel sounds present, no hepatosplenomegaly Extremities- {EDEMA LEVEL:28147::"No"} peripheral edema. no clubbing or cyanosis; DP/PT/radial pulses 2+ bilaterally MS- no significant deformity or atrophy Skin- warm and dry, no rash or lesion Psych- euthymic mood, full affect Neuro- strength and sensation are intact   EKG is ordered. Personal review of EKG from today shows:  ***  Recent Labs: 09/19/2021: ALT 58; BUN 10; Creatinine, Ser 1.43; Hemoglobin 15.5; Platelets 125; Potassium 3.4; Sodium 138; TSH 4.940  09/19/2021: Cholesterol, Total 135; HDL 49; LDL Chol Calc (NIH) 47; Triglycerides 249   CrCl cannot be calculated (Patient's most recent lab result is older than the maximum 21 days allowed.).   Wt Readings from Last 3 Encounters:  09/19/21 275 lb (124.7 kg)  09/11/21 281 lb (127.5 kg)  02/20/21 286 lb (129.7 kg)     Additional studies reviewed include: Previous EP, cardiology notes.   TTE, 09/21/2020  1. Left ventricular ejection fraction, by estimation, is 30 to 35%. The left ventricle has moderate to severely decreased function. The left ventricle demonstrates global hypokinesis. There is mild left ventricular hypertrophy. Left ventricular diastolic parameters are indeterminate.   2. Right ventricular systolic function is normal. The right ventricular size is normal.   3. Left atrial size was mildly dilated.   4. The mitral valve is grossly normal. No evidence of mitral valve regurgitation.   5. The aortic valve was not well visualized. Aortic valve regurgitation is not visualized. Mild aortic valve sclerosis is present, with no evidence of aortic valve stenosis.   6. Aortic dilatation noted. There is moderate dilatation of the ascending aorta, measuring 48 mm. There is mild dilatation of the aortic root, measuring 39 mm.   7. The inferior vena cava is normal in size with greater than 50%  respiratory variability, suggesting right atrial pressure of 3 mmHg.   Comparison(s): ASD was not demonstrated on this exam.   ASSESSMENT AND PLAN:  #) parox Afib Maintaining SR on amiodarone - update amiodarone labs today CHA2DS2-VASc Score = 2  (CHF, HTN)[CHF History: 1, HTN History: 1, Diabetes History: 0, Stroke History: 0, Vascular Disease History: 0, Age Score: 0, Gender Score: 0].  Therefore, the patient's annual risk of stroke is 2.2 %. [place] - eliquis 5mg  BID, appropriately dosed     {Confirm score is correct.  If not, click here to update score.  REFRESH note.  :1}     #) HFrEF    Current medicines are reviewed at length with the patient today.   The patient {ACTIONS; HAS/DOES NOT HAVE:19233} concerns regarding his medicines.  The following changes were made today:  {NONE DEFAULTED:18576}  Labs/ tests ordered today include: *** No orders of the defined types were placed in this encounter.    Disposition: Follow up with {EPMDS:28135} in {EPFOLLOW UP:28173}   Signed, Mamie Levers, NP  09/02/22  12:53 PM  Electrophysiology [facility]

## 2022-09-03 ENCOUNTER — Ambulatory Visit (INDEPENDENT_AMBULATORY_CARE_PROVIDER_SITE_OTHER): Payer: BC Managed Care – PPO | Admitting: Cardiology

## 2022-09-03 DIAGNOSIS — I4819 Other persistent atrial fibrillation: Secondary | ICD-10-CM

## 2022-09-03 DIAGNOSIS — I5022 Chronic systolic (congestive) heart failure: Secondary | ICD-10-CM

## 2022-09-08 ENCOUNTER — Ambulatory Visit (HOSPITAL_COMMUNITY): Admission: RE | Admit: 2022-09-08 | Payer: BC Managed Care – PPO | Source: Ambulatory Visit | Admitting: Cardiology

## 2022-09-08 ENCOUNTER — Encounter (HOSPITAL_COMMUNITY): Admission: RE | Payer: Self-pay | Source: Ambulatory Visit

## 2022-09-08 SURGERY — ATRIAL FIBRILLATION ABLATION
Anesthesia: General

## 2022-09-22 ENCOUNTER — Ambulatory Visit (HOSPITAL_COMMUNITY): Payer: Self-pay | Admitting: Physician Assistant

## 2022-09-23 ENCOUNTER — Other Ambulatory Visit: Payer: BC Managed Care – PPO

## 2022-10-06 ENCOUNTER — Ambulatory Visit (HOSPITAL_COMMUNITY): Payer: Self-pay | Admitting: Physician Assistant

## 2022-11-09 NOTE — Progress Notes (Unsigned)
Electrophysiology Office Follow up Visit Note:    Date:  11/12/2022   ID:  Joseph [name], DOB 12-04-1960, MRN [id number]  PCP:  Marjie Skiff, NP  [facility] Cardiologist:  None  [facility] Electrophysiologist:  Lanier Prude, MD    Interval History:    Joseph [name] is a 62 y.o. male who presents for a follow up visit.   I have previously met the patient regarding his atrial fibrillation.  He was scheduled for catheter ablation but this was canceled due to a death in his family.  He is back in clinic and wants to get back on track with treating his heart failure and atrial fibrillation.  He had a difficult year with the passing of his mother.  No swelling.  Intermittently will feel his heart race.     Past medical, surgical, social and family history were reviewed.  ROS:   Please see the history of present illness.    All other systems reviewed and are negative.  EKGs/Labs/Other Studies Reviewed:    The following studies were reviewed today:  EKG September 11, 2021 shows sinus rhythm  Labs show elevated AST/ALT, significantly elevated ferritin and iron saturation.  EKG:  The ekg ordered today demonstrates sinus rhythm.  PVC.   Physical Exam:    VS:  BP 106/70   Pulse 69   Ht 6' (1.829 m)   Wt 270 lb (122.5 kg)   SpO2 93%   BMI 36.62 kg/m     Wt Readings from Last 3 Encounters:  11/12/22 270 lb (122.5 kg)  09/19/21 275 lb (124.7 kg)  09/11/21 281 lb (127.5 kg)     GEN:  Well nourished, well developed in no acute distress CARDIAC: RRR, no murmurs, rubs, gallops RESPIRATORY:  Clear to auscultation without rales, wheezing or rhonchi       ASSESSMENT:    1. Persistent atrial fibrillation (HCC)   2. Chronic systolic heart failure (HCC)   3. Elevated ferritin    PLAN:    In order of problems listed above:   #Persistent atrial fibrillation #High risk med monitoring-amiodarone Symptomatic.  Associated with chronic systolic heart  failure. Continue amiodarone and Eliquis  Discussed treatment options today for AF including antiarrhythmic drug therapy and ablation. Discussed risks, recovery and likelihood of success with each treatment strategy. Risk, benefits, and alternatives to EP study and radiofrequency ablation for afib were discussed. These risks include but are not limited to stroke, bleeding, vascular damage, tamponade, perforation, damage to the esophagus, lungs, phrenic nerve and other structures, pulmonary vein stenosis, worsening renal function, and death.  Discussed potential need for repeat ablation procedures and antiarrhythmic drugs after an initial ablation. The patient understands these risk and wishes to proceed.  We will therefore proceed with catheter ablation at the next available time.  Carto, ICE, anesthesia are requested for the procedure.  Will also obtain CT PV protocol prior to the procedure to exclude LAA thrombus and further evaluate atrial anatomy.  Patient will need an echo prior to the procedure  Needs updated CMP, TSH and free T4 for amiodarone monitoring.  Given his abnormal blood work I will reduce his dose of amiodarone to 100 mg by mouth once daily.  #Chronic systolic heart failure NYHA class II.  Last ejection fraction in 2022 showed an EF of 30 to 35%.  #Elevated ferritin, iron saturation, transaminases Concerning for hemochromatosis.  I wonder if his systolic heart failure may be related.  Unguinal order a cardiac  MRI to further assess for infiltrative cardiomyopathy.    Signed, Steffanie Dunn, MD, [facility], [facility] 11/12/2022 9:49 AM    Electrophysiology [facility]

## 2022-11-12 ENCOUNTER — Ambulatory Visit: Payer: BC Managed Care – PPO | Attending: Cardiology | Admitting: Cardiology

## 2022-11-12 ENCOUNTER — Encounter: Payer: Self-pay | Admitting: Cardiology

## 2022-11-12 VITALS — BP 106/70 | HR 69 | Ht 72.0 in | Wt 270.0 lb

## 2022-11-12 DIAGNOSIS — I5022 Chronic systolic (congestive) heart failure: Secondary | ICD-10-CM

## 2022-11-12 DIAGNOSIS — I4819 Other persistent atrial fibrillation: Secondary | ICD-10-CM | POA: Diagnosis not present

## 2022-11-12 DIAGNOSIS — R7989 Other specified abnormal findings of blood chemistry: Secondary | ICD-10-CM

## 2022-11-12 MED ORDER — AMIODARONE HCL 100 MG PO TABS: 100.0000 mg | ORAL_TABLET | Freq: Every day | ORAL | 3 refills | Status: DC

## 2022-11-12 NOTE — Patient Instructions (Addendum)
Medication Instructions:  Your physician has recommended you make the following change in your medication:  1) DECREASE amiodarone to 100 mg daily  *If you need a refill on your cardiac medications before your next appointment, please call your pharmacy*  Lab Work: BMET and CBC prior to CT scan and ablation  You will get your lab work at [facility]) hospital.  Your lab work will be done at [facility] next to [employer].  These are walk in labs- you will not need an appointment and you do not need to be fasting.    Testing/Procedures: Your physician has requested that you have a cardiac MRI. Cardiac MRI uses a computer to create images of your heart as its beating, producing both still and moving pictures of your heart and major blood vessels. For further information please visit [email or web address]. Please follow the instruction sheet given to you today for more information.  Your physician has requested that you have cardiac CT. Cardiac computed tomography (CT) is a painless test that uses an x-ray machine to take clear, detailed pictures of your heart. For further information please visit [email or web address] This will be done about one week prior to your ablation. We will call you to schedule.  Your physician has recommended that you have an ablation. Catheter ablation is a medical procedure used to treat some cardiac arrhythmias (irregular heartbeats). During catheter ablation, a long, thin, flexible tube is put into a blood vessel in your groin (upper thigh), or neck. This tube is called an ablation catheter. It is then guided to your heart through the blood vessel. Radio frequency waves destroy small areas of heart tissue where abnormal heartbeats may cause an arrhythmia to start. You are scheduled for Atrial Fibrillation Ablation on Friday, September 27 with Dr. Steffanie Dunn.Please arrive at the Main Entrance A at [facility]: [address] [place],  [place] [address] at 8:30 AM   Follow-Up: At [facility], you and your health needs are our priority.  As part of our continuing mission to provide you with exceptional heart care, we have created designated Provider Care Teams.  These Care Teams include your primary Cardiologist (physician) and Advanced Practice Providers (APPs -  Physician Assistants and Nurse Practitioners) who all work together to provide you with the care you need, when you need it.  Your next appointment:   We will call you to arrange your follow up appointments.    You are scheduled for Cardiac MRI on ______________. Please arrive for your appointment at ______________ ( arrive 30-45 minutes prior to test start time). ?   [facility] [address] [place], [place] [address] [phone] Please take advantage of the free valet parking available at the MAIN entrance. Proceed to [facility] registration for check-in (first floor).  Magnetic resonance imaging (MRI) is a painless test that produces images of the inside of the body without using Xrays.  During an MRI, strong magnets and radio waves work together in a Data processing manager to form detailed images.   MRI images may provide more details about a medical condition than X-rays, CT scans, and ultrasounds can provide.  You may be given earphones to listen for instructions.  You may eat a light breakfast and take medications as ordered with the exception of furosemide, hydrochlorothiazide, or spironolactone(fluid pill, other). Please avoid stimulants for 12 hr prior to test. (Ie. Caffeine, nicotine, chocolate, or antihistamine medications)  If a contrast material will be used,  an IV will be inserted into one of your veins. Contrast material will be injected into your IV. It will leave your body through your urine within a day. You may be told to drink plenty of fluids to help flush the contrast material out of your system.  You will be asked to  remove all metal, including: Watch, jewelry, and other metal objects including hearing aids, hair pieces and dentures. Also wearable glucose monitoring systems (ie. Freestyle Libre and Omnipods) (Braces and fillings normally are not a problem.)   TEST WILL TAKE APPROXIMATELY 1 HOUR  PLEASE NOTIFY SCHEDULING AT LEAST 24 HOURS IN ADVANCE IF YOU ARE UNABLE TO KEEP YOUR APPOINTMENT. [phone]  Please call Rockwell Alexandria, cardiac imaging nurse navigator with any questions/concerns. Rockwell Alexandria RN Navigator Cardiac Imaging Larey Brick RN Navigator Cardiac Imaging [facility] [phone] Office

## 2022-11-21 ENCOUNTER — Other Ambulatory Visit: Payer: Self-pay | Admitting: Cardiology

## 2022-11-26 ENCOUNTER — Ambulatory Visit: Payer: Self-pay | Admitting: Cardiology

## 2022-12-01 ENCOUNTER — Other Ambulatory Visit: Payer: Self-pay | Admitting: Nurse Practitioner

## 2022-12-02 NOTE — Telephone Encounter (Signed)
Requested medications are due for refill today.  Unsure  Requested medications are on the active medications list.  yes  Last refill. 11/12/2022  Future visit scheduled.   no  Notes to clinic.  Pt is more than 3 months overdue for an ov. Medication is historical. Labs are expired.    Requested Prescriptions  Pending Prescriptions Disp Refills   rosuvastatin (CRESTOR) 10 MG tablet [Pharmacy Med Name: ROSUVASTATIN CALCIUM 10 MG TAB] 90 tablet 1    Sig: TAKE 1 TABLET BY MOUTH EVERY DAY     Cardiovascular:  Antilipid - Statins 2 Failed - 12/01/2022  2:43 AM      Failed - Cr in normal range and within 360 days    Creatinine, Ser  Date Value Ref Range Status  09/19/2021 1.43 (H) 0.76 - 1.27 mg/dL Final         Failed - Valid encounter within last 12 months    Recent Outpatient Visits           1 year ago CHF (congestive heart failure), NYHA class II, chronic, systolic (HCC)   [place] [facility] [place], [place] T, NP   1 year ago Alcohol use disorder, moderate, dependence (HCC)   [place] [facility] [place], Corrie Dandy T, NP   2 years ago Tachycardia   [place] [facility] [place], Corrie Dandy T, NP   3 years ago Need for Tdap vaccination   [place] [facility] [place], [place] T, NP   3 years ago Chronic pain of both knees    [place] [facility] [place], [place] T, NP              Failed - Lipid Panel in normal range within the last 12 months    Cholesterol, Total  Date Value Ref Range Status  09/19/2021 135 100 - 199 mg/dL Final   LDL Chol Calc (NIH)  Date Value Ref Range Status  09/19/2021 47 0 - 99 mg/dL Final   HDL  Date Value Ref Range Status  09/19/2021 49 >39 mg/dL Final   Triglycerides  Date Value Ref Range Status  09/19/2021 249 (H) 0 - 149 mg/dL Final         Passed - Patient is not pregnant

## 2022-12-10 ENCOUNTER — Ambulatory Visit: Payer: Self-pay | Admitting: Cardiology

## 2022-12-22 DIAGNOSIS — M170 Bilateral primary osteoarthritis of knee: Secondary | ICD-10-CM | POA: Diagnosis not present

## 2022-12-25 ENCOUNTER — Ambulatory Visit: Payer: BC Managed Care – PPO

## 2023-01-22 MED ORDER — APIXABAN 5 MG PO TABS: 5.0000 mg | ORAL_TABLET | Freq: Two times a day (BID) | ORAL | 0 refills | Status: DC

## 2023-01-22 NOTE — Telephone Encounter (Signed)
Pt last saw Dr Lalla Brothers 11/12/22, last labs 09/19/21 Creat 1.43, overdue for labwork.  Pt has orders for CBC and BMP to be done prior to CT scan and ablation 03/06/23.  Age 62, weight 122.5kg, based on specified criteria pt is on appropriate dosage of Eliquis 5mg  BID for afib.  Will refill rx to get pt to upcoming appt for labwork.

## 2023-01-26 ENCOUNTER — Encounter (HOSPITAL_COMMUNITY): Payer: Self-pay

## 2023-01-28 ENCOUNTER — Ambulatory Visit: Payer: BC Managed Care – PPO

## 2023-01-28 DIAGNOSIS — M25511 Pain in right shoulder: Secondary | ICD-10-CM | POA: Diagnosis not present

## 2023-01-28 DIAGNOSIS — Z5181 Encounter for therapeutic drug level monitoring: Secondary | ICD-10-CM | POA: Diagnosis not present

## 2023-01-28 DIAGNOSIS — Z79899 Other long term (current) drug therapy: Secondary | ICD-10-CM | POA: Diagnosis not present

## 2023-01-28 DIAGNOSIS — Z79891 Long term (current) use of opiate analgesic: Secondary | ICD-10-CM | POA: Diagnosis not present

## 2023-01-30 ENCOUNTER — Ambulatory Visit: Payer: BC Managed Care – PPO

## 2023-02-01 DIAGNOSIS — K760 Fatty (change of) liver, not elsewhere classified: Secondary | ICD-10-CM | POA: Insufficient documentation

## 2023-02-05 ENCOUNTER — Encounter: Payer: Self-pay | Admitting: Cardiology

## 2023-02-06 ENCOUNTER — Encounter: Payer: BC Managed Care – PPO | Admitting: Nurse Practitioner

## 2023-02-06 DIAGNOSIS — I5022 Chronic systolic (congestive) heart failure: Secondary | ICD-10-CM

## 2023-02-06 DIAGNOSIS — I4819 Other persistent atrial fibrillation: Secondary | ICD-10-CM

## 2023-02-06 DIAGNOSIS — G4733 Obstructive sleep apnea (adult) (pediatric): Secondary | ICD-10-CM

## 2023-02-06 DIAGNOSIS — R7989 Other specified abnormal findings of blood chemistry: Secondary | ICD-10-CM

## 2023-02-06 DIAGNOSIS — F1020 Alcohol dependence, uncomplicated: Secondary | ICD-10-CM

## 2023-02-06 DIAGNOSIS — Z0000 Encounter for general adult medical examination without abnormal findings: Secondary | ICD-10-CM

## 2023-02-06 DIAGNOSIS — D696 Thrombocytopenia, unspecified: Secondary | ICD-10-CM

## 2023-02-06 DIAGNOSIS — D6869 Other thrombophilia: Secondary | ICD-10-CM

## 2023-02-06 DIAGNOSIS — I77819 Aortic ectasia, unspecified site: Secondary | ICD-10-CM

## 2023-02-06 DIAGNOSIS — N400 Enlarged prostate without lower urinary tract symptoms: Secondary | ICD-10-CM

## 2023-02-06 DIAGNOSIS — K760 Fatty (change of) liver, not elsewhere classified: Secondary | ICD-10-CM

## 2023-02-15 ENCOUNTER — Other Ambulatory Visit: Payer: Self-pay | Admitting: Cardiology

## 2023-02-24 ENCOUNTER — Ambulatory Visit: Payer: BC Managed Care – PPO | Attending: Cardiology

## 2023-02-24 DIAGNOSIS — I480 Paroxysmal atrial fibrillation: Secondary | ICD-10-CM | POA: Diagnosis not present

## 2023-02-24 LAB — ECHOCARDIOGRAM COMPLETE
AR max vel: 3.95 cm2
AV Area VTI: 4 cm2
AV Area mean vel: 3.84 cm2
AV Mean grad: 3 mmHg
AV Peak grad: 5.6 mmHg
Ao pk vel: 1.18 m/s
Area-P 1/2: 2.95 cm2

## 2023-02-24 MED ORDER — PERFLUTREN LIPID MICROSPHERE
1.0000 mL | INTRAVENOUS | Status: AC | PRN
  Administered 2023-02-24: 2 mL via INTRAVENOUS

## 2023-02-27 ENCOUNTER — Ambulatory Visit
Admission: RE | Admit: 2023-02-27 | Discharge: 2023-02-27 | Disposition: A | Discharge status: Home / Self Care | Payer: BC Managed Care – PPO | Source: Ambulatory Visit | Attending: Cardiology | Admitting: Cardiology

## 2023-02-27 DIAGNOSIS — I5022 Chronic systolic (congestive) heart failure: Secondary | ICD-10-CM | POA: Diagnosis not present

## 2023-02-27 DIAGNOSIS — I4819 Other persistent atrial fibrillation: Secondary | ICD-10-CM | POA: Diagnosis not present

## 2023-02-27 MED ORDER — SODIUM CHLORIDE 0.9 % IV SOLN: INTRAVENOUS | Status: DC

## 2023-02-27 MED ORDER — IOHEXOL 350 MG/ML SOLN
100.0000 mL | Freq: Once | INTRAVENOUS | Status: AC | PRN
  Administered 2023-02-27: 100 mL via INTRAVENOUS

## 2023-03-04 ENCOUNTER — Encounter: Payer: BC Managed Care – PPO | Admitting: Nurse Practitioner

## 2023-03-04 DIAGNOSIS — I5022 Chronic systolic (congestive) heart failure: Secondary | ICD-10-CM

## 2023-03-04 DIAGNOSIS — R030 Elevated blood-pressure reading, without diagnosis of hypertension: Secondary | ICD-10-CM

## 2023-03-04 DIAGNOSIS — N400 Enlarged prostate without lower urinary tract symptoms: Secondary | ICD-10-CM

## 2023-03-04 DIAGNOSIS — F1020 Alcohol dependence, uncomplicated: Secondary | ICD-10-CM

## 2023-03-04 DIAGNOSIS — Z0000 Encounter for general adult medical examination without abnormal findings: Secondary | ICD-10-CM

## 2023-03-04 DIAGNOSIS — I4819 Other persistent atrial fibrillation: Secondary | ICD-10-CM

## 2023-03-04 DIAGNOSIS — R7989 Other specified abnormal findings of blood chemistry: Secondary | ICD-10-CM

## 2023-03-04 DIAGNOSIS — G4733 Obstructive sleep apnea (adult) (pediatric): Secondary | ICD-10-CM

## 2023-03-04 DIAGNOSIS — Z23 Encounter for immunization: Secondary | ICD-10-CM

## 2023-03-04 DIAGNOSIS — I77819 Aortic ectasia, unspecified site: Secondary | ICD-10-CM

## 2023-03-04 DIAGNOSIS — D696 Thrombocytopenia, unspecified: Secondary | ICD-10-CM

## 2023-03-04 DIAGNOSIS — E785 Hyperlipidemia, unspecified: Secondary | ICD-10-CM

## 2023-03-04 DIAGNOSIS — D6869 Other thrombophilia: Secondary | ICD-10-CM

## 2023-03-05 ENCOUNTER — Encounter (HOSPITAL_COMMUNITY): Payer: Self-pay | Admitting: Anesthesiology

## 2023-03-05 NOTE — Pre-Procedure Instructions (Signed)
Instructed patient on the following items: Arrival time 8:15 Nothing to eat or drink after midnight No meds AM of procedure Responsible person to drive you home and stay with you for 24 hrs  Have you missed any doses of anti-coagulant Eliquis- takes twice a day, hasn't missed any doses.  Don't take dose in the morning.

## 2023-03-06 ENCOUNTER — Telehealth: Payer: Self-pay | Admitting: Cardiology

## 2023-03-06 ENCOUNTER — Encounter (HOSPITAL_COMMUNITY): Admission: RE | Payer: Self-pay | Source: Home / Self Care

## 2023-03-06 ENCOUNTER — Ambulatory Visit (HOSPITAL_COMMUNITY): Admission: RE | Admit: 2023-03-06 | Payer: BC Managed Care – PPO | Source: Home / Self Care | Admitting: Cardiology

## 2023-03-06 SURGERY — ATRIAL FIBRILLATION ABLATION
Anesthesia: General

## 2023-03-06 NOTE — Telephone Encounter (Signed)
Patient is calling to make [place] aware he will not be in for his ablation this morning.

## 2023-03-06 NOTE — Telephone Encounter (Signed)
Spoke with the patient. His ablation has been rescheduled for 10/11 at 1030.

## 2023-03-11 ENCOUNTER — Other Ambulatory Visit: Payer: Self-pay | Admitting: Nurse Practitioner

## 2023-03-14 NOTE — Patient Instructions (Signed)
Heart Failure Eating Plan Heart failure, also called congestive heart failure, occurs when your heart does not pump blood well enough to meet your body's needs for oxygen-rich blood. Heart failure is a long-term (chronic) condition. Living with heart failure can be challenging. Following your health care provider's instructions about a healthy lifestyle and working with a dietitian to choose the right foods may help to improve your symptoms. An eating plan for someone with heart failure will include changes that limit the intake of salt (sodium) and unhealthy fat. What are tips for following this plan? Reading food labels Check food labels for the amount of sodium per serving. Choose foods that have less than 140 mg (milligrams) of sodium in each serving. Check food labels for the number of calories per serving. This is important if you need to limit your daily calorie intake to lose weight. Check food labels for the serving size. If you eat more than one serving, you will be eating more sodium and calories than what is listed on the label. Look for foods that are labeled as "sodium-free," "very low sodium," or "low sodium." Foods labeled as "reduced sodium" or "lightly salted" may still have more sodium than what is recommended for you. Cooking Avoid adding salt when cooking. Ask your health care provider or dietitian before using salt substitutes. Season food with salt-free seasonings, spices, or herbs. Check the label of seasoning mixes to make sure they do not contain salt. Cook with heart-healthy oils, such as olive, canola, soybean, or sunflower oil. Do not fry foods. Cook foods using low-fat methods, such as baking, boiling, grilling, and broiling. Limit unhealthy fats when cooking by: Removing the skin from poultry, such as chicken. Removing all visible fats from meats. Skimming the fat off from stews, soups, and gravies before serving them. Meal planning  Limit your intake  of: Processed, canned, or prepackaged foods. Foods that are high in trans fat, such as fried foods. Sweets, desserts, sugary drinks, and other foods with added sugar. Full-fat dairy products, such as whole milk. Eat a balanced diet. This may include: 4-5 servings of fruit each day and 4-5 servings of vegetables each day. At each meal, try to fill one-half of your plate with fruits and vegetables. Up to 6-8 servings of whole grains each day. Up to 2 servings of lean meat, poultry, or fish each day. One serving of meat is equal to 3 oz (85 g). This is about the same size as a deck of cards. 2 servings of low-fat dairy each day. Heart-healthy fats. Healthy fats called omega-3 fatty acids are found in foods such as flaxseed and cold-water fish like sardines, salmon, and mackerel. Aim to eat 25-35 g (grams) of fiber a day. Foods that are high in fiber include apples, broccoli, carrots, beans, peas, and whole grains. Do not add salt or condiments that contain salt (such as soy sauce) to foods before eating. When eating at a restaurant, ask that your food be prepared with less salt or no salt, if possible. Try to eat 2 or more vegetarian meals each week. Eat more home-cooked food and eat less restaurant, buffet, and fast food. General information Do not eat more than 2,300 mg of sodium a day. The amount of sodium that is recommended for you may be lower, depending on your condition. Maintain a healthy body weight as directed. Ask your health care provider what a healthy weight is for you. Check your weight every day. Work with your health care provider   and dietitian to make a plan that is right for you to lose weight or maintain your current weight. Limit how much fluid you drink. Ask your health care provider or dietitian how much fluid you can have each day. Limit or avoid alcohol as told by your health care provider or dietitian. Recommended foods Fruits All fresh, frozen, and canned fruits.  Dried fruits, such as raisins, prunes, and cranberries. Vegetables All fresh vegetables. Vegetables that are frozen without sauce or added salt. Low-sodium or sodium-free canned vegetables. Grains Bread with less than 80 mg of sodium per slice. Whole-wheat pasta, quinoa, and brown rice. Oats and oatmeal. Barley. Millet. Grits and cream of wheat. Whole-grain and whole-wheat cold cereal. Meats and other protein foods Lean cuts of meat. Skinless chicken and turkey. Fish with high omega-3 fatty acids, such as salmon, sardines, and other cold-water fishes. Eggs. Dried beans, peas, and edamame. Unsalted nuts and nut butters. Dairy Low-fat or nonfat (skim) milk and dried milk. Rice milk, soy milk, and almond milk. Low-fat or nonfat yogurt. Small amounts of reduced-sodium block cheese. Low-sodium cottage cheese. Fats and oils Olive, canola, soybean, flaxseed, avocado, or sunflower oil. Sweets and desserts Applesauce. Granola bars. Sugar-free pudding and gelatin. Frozen fruit bars. Seasoning and other foods Fresh and dried herbs. Lemon or lime juice. Vinegar. Low-sodium ketchup. Salt-free marinades, salad dressings, sauces, and seasonings. The items listed above may not be a complete list of foods and beverages you can eat. Contact a dietitian for more information. Foods to avoid Fruits Fruits that are dried with sodium-containing preservatives. Vegetables Canned vegetables. Frozen vegetables with sauce or seasonings. Creamed vegetables. French fries. Onion rings. Pickled vegetables and sauerkraut. Grains Bread with more than 80 mg of sodium per slice. Hot or cold cereal with more than 140 mg sodium per serving. Salted pretzels and crackers. Prepackaged breadcrumbs. Bagels, croissants, and biscuits. Meats and other protein foods Ribs and chicken wings. Bacon, ham, pepperoni, bologna, salami, and packaged luncheon meats. Hot dogs, bratwurst, and sausage. Canned meat. Smoked meat and fish. Salted nuts  and seeds. Dairy Whole milk, half-and-half, and cream. Buttermilk. Processed cheese, cheese spreads, and cheese curds. Regular cottage cheese. Feta cheese. Shredded cheese. String cheese. Fats and oils Butter, lard, shortening, ghee, and bacon fat. Canned and packaged gravies. Seasoning and other foods Onion salt, garlic salt, table salt, and sea salt. Marinades. Regular salad dressings. Relishes, pickles, and olives. Meat flavorings and tenderizers, and bouillon cubes. Horseradish, ketchup, and mustard. Worcestershire sauce. Teriyaki sauce, soy sauce (including reduced sodium). Hot sauce and Tabasco sauce. Steak sauce, fish sauce, oyster sauce, and cocktail sauce. Taco seasonings. Barbecue sauce. Tartar sauce. The items listed above may not be a complete list of foods and beverages you should avoid. Contact a dietitian for more information. Summary A heart failure eating plan includes changes that limit your intake of sodium and unhealthy fat, and it may help you lose weight or maintain a healthy weight. Your health care provider may also recommend limiting how much fluid you drink. Most people with heart failure should eat no more than 2,300 mg of salt (sodium) a day. The amount of sodium that is recommended for you may be lower, depending on your condition. Contact your health care provider or dietitian before making any major changes to your diet. This information is not intended to replace advice given to you by your health care provider. Make sure you discuss any questions you have with your health care provider. Document Revised: 09/03/2021 Document Reviewed: 01/09/2020   Elsevier Patient Education  2024 [employer].  

## 2023-03-17 ENCOUNTER — Encounter: Payer: Self-pay | Admitting: Nurse Practitioner

## 2023-03-17 ENCOUNTER — Ambulatory Visit (INDEPENDENT_AMBULATORY_CARE_PROVIDER_SITE_OTHER): Payer: BC Managed Care – PPO | Admitting: Nurse Practitioner

## 2023-03-17 VITALS — BP 138/82 | HR 76 | Temp 98.7°F | Ht 72.4 in | Wt 276.0 lb

## 2023-03-17 DIAGNOSIS — Z23 Encounter for immunization: Secondary | ICD-10-CM | POA: Diagnosis not present

## 2023-03-17 DIAGNOSIS — D696 Thrombocytopenia, unspecified: Secondary | ICD-10-CM

## 2023-03-17 DIAGNOSIS — I77819 Aortic ectasia, unspecified site: Secondary | ICD-10-CM

## 2023-03-17 DIAGNOSIS — R7989 Other specified abnormal findings of blood chemistry: Secondary | ICD-10-CM

## 2023-03-17 DIAGNOSIS — I4819 Other persistent atrial fibrillation: Secondary | ICD-10-CM | POA: Diagnosis not present

## 2023-03-17 DIAGNOSIS — I5022 Chronic systolic (congestive) heart failure: Secondary | ICD-10-CM

## 2023-03-17 DIAGNOSIS — D6869 Other thrombophilia: Secondary | ICD-10-CM | POA: Diagnosis not present

## 2023-03-17 DIAGNOSIS — K760 Fatty (change of) liver, not elsewhere classified: Secondary | ICD-10-CM

## 2023-03-17 DIAGNOSIS — R030 Elevated blood-pressure reading, without diagnosis of hypertension: Secondary | ICD-10-CM | POA: Diagnosis not present

## 2023-03-17 DIAGNOSIS — Z0000 Encounter for general adult medical examination without abnormal findings: Secondary | ICD-10-CM

## 2023-03-17 DIAGNOSIS — F1020 Alcohol dependence, uncomplicated: Secondary | ICD-10-CM

## 2023-03-17 DIAGNOSIS — N400 Enlarged prostate without lower urinary tract symptoms: Secondary | ICD-10-CM

## 2023-03-17 DIAGNOSIS — G4733 Obstructive sleep apnea (adult) (pediatric): Secondary | ICD-10-CM

## 2023-03-17 MED ORDER — ROSUVASTATIN CALCIUM 10 MG PO TABS: 10.0000 mg | ORAL_TABLET | Freq: Every day | ORAL | 4 refills | Status: DC

## 2023-03-17 NOTE — Assessment & Plan Note (Signed)
Continue collaboration with cardiology, noted on imaging 09/21/20.

## 2023-03-17 NOTE — Assessment & Plan Note (Signed)
Initial BP elevated and repeat closer to goal, recent with cardiology was well below goal.  Due to A-fib better to check BP manually.  Recommend focus on DASH diet and modest weight loss at home.  Check BP at home at least 3 mornings a week and document.  Will obtain CMP and TSH today.  Suspect more white coat hypertension, but will monitor and initiate medication as needed.

## 2023-03-17 NOTE — Assessment & Plan Note (Signed)
Stable, noted on past labs.  Recheck today and monitor closely.

## 2023-03-17 NOTE — Assessment & Plan Note (Signed)
Is on Amiodarone, will monitor thyroid closely with this.  Labs today and treat as needed.

## 2023-03-17 NOTE — Assessment & Plan Note (Signed)
Does drink heavily at times.  Recheck labs today and if ongoing or worsening elevations may need to send to hematology.  Highly recommend he cut back on alcohol use.

## 2023-03-17 NOTE — Progress Notes (Signed)
BP 138/82 (BP Location: Left Arm, Patient Position: Sitting, Cuff Size: Large)   Pulse 76   Temp 98.7 F (37.1 C) (Oral)   Ht 6' 0.4" (1.839 m)   Wt 276 lb (125.2 kg)   SpO2 98%   BMI 37.02 kg/m    Subjective:    Patient ID: Joseph [name], male    DOB: 1960/07/30, 62 y.o.   MRN: [id number]  HPI: Joseph [name] is a 62 y.o. male presenting on 03/17/2023 for comprehensive medical examination. Current medical complaints include:none  He currently lives with: wife Interim Problems from his last visit: no  HYPERTENSION / HYPERLIPIDEMIA/HF Currently taking Metoprolol, Eliquis, Amiodarone.  Last visit with cardiology, Dr. Lalla Brothers, was 11/12/22 and he is scheduled for ablation 03/20/23.  Needs refills Rosuvastatin.  Is not currently using CPAP, gave it back to company.  He may look into [employer] in future.   Continues to use alcohol -- 2 Bourbon shots last night.  On average currently 2 shots a night.  Has known hepatic steatosis. Satisfied with current treatment? yes Duration of hypertension: chronic BP monitoring frequency: not checking BP range:  BP medication side effects: no Duration of hyperlipidemia: chronic Cholesterol medication side effects: no Cholesterol supplements: none Medication compliance: good compliance Aspirin: no Recent stressors: no Recurrent headaches: no Visual changes: no Palpitations: occasional Dyspnea: no Chest pain: no Lower extremity edema: no Dizzy/lightheaded: no  Flowsheet Row Office Visit from 03/17/2023 in [facility]  AUDIT-C Score 6       Flowsheet Row Office Visit from 03/17/2023 in [facility]  Alcohol Use Disorder Identification Test Final Score (AUDIT) 15       Functional Status Survey: Is the patient deaf or have difficulty hearing?: No Does the patient have difficulty seeing, even when wearing glasses/contacts?: No Does the patient have difficulty concentrating,  remembering, or making decisions?: No Does the patient have difficulty walking or climbing stairs?: No Does the patient have difficulty dressing or bathing?: No Does the patient have difficulty doing errands alone such as visiting a doctor's office or shopping?: No     03/17/2023    1:08 PM 09/19/2021    8:49 AM 06/27/2019    9:07 AM 06/27/2019    8:27 AM  GAD 7 : Generalized Anxiety Score  Nervous, Anxious, on Edge 0 1 0 0  Control/stop worrying 0 1 0 0  Worry too much - different things 0 0 0 0  Trouble relaxing 0 2 0 0  Restless 0 0 0 0  Easily annoyed or irritable 0 1 0 0  Afraid - awful might happen 0 0 0 0  Total GAD 7 Score 0 5 0 0  Anxiety Difficulty Not difficult at all Very difficult  Not difficult at all   Depression Screen    03/17/2023    1:08 PM 09/19/2021    8:49 AM 07/02/2020    9:40 AM 08/12/2019    8:13 AM 06/27/2019    9:07 AM  Depression screen PHQ 2/9  Decreased Interest 0 2 0 0 0  Down, Depressed, Hopeless 0 2 0 0 0  PHQ - 2 Score 0 4 0 0 0  Altered sleeping 0 1   0  Tired, decreased energy 1 2   0  Change in appetite 0 1   0  Feeling bad or failure about yourself  0 2   0  Trouble concentrating 0 0   0  Moving slowly or  fidgety/restless 0 0   0  Suicidal thoughts 0 0   0  PHQ-9 Score 1 10   0  Difficult doing work/chores Not difficult at all Very difficult         07/02/2020    9:40 AM 09/14/2020    7:10 AM 09/19/2021    8:49 AM 11/11/2021   12:28 PM 03/17/2023    1:08 PM  Fall Risk  Falls in the past year? 0  1  0  Was there an injury with Fall?   1  0  Fall Risk Category Calculator   3  0  Fall Risk Category (Retired)   High    (RETIRED) Patient Fall Risk Level  High fall risk  Low fall risk   Patient at Risk for Falls Due to No Fall Risks  History of fall(s)  No Fall Risks  Fall risk Follow up   Falls evaluation completed  Falls evaluation completed    Past Medical History:  Past Medical History:  Diagnosis Date   Arthritis 07/06/2020   In both  knees   Atrial fibrillation (HCC)    Cataract 06/09/2021   Hernia, hiatal    20 years ago   Tachycardia     Surgical History:  Past Surgical History:  Procedure Laterality Date   CARDIOVERSION N/A 07/10/2020   Procedure: CARDIOVERSION;  Surgeon: Yvonne Kendall, MD;  Location: [facility] ORS;  Service: Cardiovascular;  Laterality: N/A;   CARDIOVERSION N/A 07/30/2020   Procedure: CARDIOVERSION;  Surgeon: Iran Ouch, MD;  Location: [facility] ORS;  Service: Cardiovascular;  Laterality: N/A;   CARDIOVERSION N/A 09/14/2020   Procedure: CARDIOVERSION;  Surgeon: Iran Ouch, MD;  Location: [facility] ORS;  Service: Cardiovascular;  Laterality: N/A;   COLONOSCOPY WITH PROPOFOL N/A 12/02/2019   Procedure: COLONOSCOPY WITH PROPOFOL;  Surgeon: Wyline Mood, MD;  Location: [facility] ENDOSCOPY;  Service: Gastroenterology;  Laterality: N/A;   EYE SURGERY  06/23/2022   Cataract surgery   HERNIA REPAIR  1996 (approximatley)   HIATAL HERNIA REPAIR     TEE WITHOUT CARDIOVERSION N/A 07/10/2020   Procedure: TEE with Cardioversion;  Surgeon: Yvonne Kendall, MD;  Location: [facility] ORS;  Service: Cardiovascular;  Laterality: N/A;    Medications:  Current Outpatient Medications on File Prior to Visit  Medication Sig   acetaminophen (TYLENOL) 500 MG tablet Take 500 mg by mouth every 6 (six) hours as needed for mild pain.   amiodarone (PACERONE) 100 MG tablet Take 1 tablet (100 mg total) by mouth daily.   apixaban (ELIQUIS) 5 MG TABS tablet Take 1 tablet (5 mg total) by mouth 2 (two) times daily. OVERDUE for LABWORK, MUST have labwork done for FUTURE refills.   metoprolol succinate (TOPROL-XL) 50 MG 24 hr tablet TAKE 1 TABLET BY MOUTH EVERY DAY   tiZANidine (ZANAFLEX) 4 MG tablet Take 4 mg by mouth at bedtime.   No current facility-administered medications on file prior to visit.    Allergies:  No Known Allergies  Social History:  Social History   Socioeconomic History   Marital status: Married    Spouse  name: Not on file   Number of children: Not on file   Years of education: Not on file   Highest education level: Some college, no degree  Occupational History    Comment: [employer]  Tobacco Use   Smoking status: Every Day   Smokeless tobacco: Current    Types: Chew   Tobacco comments:    Chewing tobacco, daily  Vaping Use  Vaping status: Never Used  Substance and Sexual Activity   Alcohol use: Yes    Alcohol/week: 5.0 standard drinks of alcohol    Types: 5 Shots of liquor per week    Comment: 12 pack on the weekend   Drug use: Not Currently    Types: Cocaine, Marijuana   Sexual activity: Not Currently  Other Topics Concern   Not on file  Social History Narrative   Caffeine use: 2 diet cokes at lunch, coffee sometimes in the morning   Right handed   Social Determinants of Health   Financial Resource Strain: Low Risk  (03/16/2023)   Overall Financial Resource Strain (CARDIA)    Difficulty of Paying Living Expenses: Not hard at all  Food Insecurity: No Food Insecurity (03/16/2023)   Hunger Vital Sign    Worried About Running Out of Food in the Last Year: Never true    Ran Out of Food in the Last Year: Never true  Transportation Needs: No Transportation Needs (03/16/2023)   PRAPARE - Administrator, Civil Service (Medical): No    Lack of Transportation (Non-Medical): No  Physical Activity: Insufficiently Active (03/16/2023)   Exercise Vital Sign    Days of Exercise per Week: 4 days    Minutes of Exercise per Session: 30 min  Stress: No Stress Concern Present (03/16/2023)   [employer] of Occupational Health - Occupational Stress Questionnaire    Feeling of Stress : Only a little  Recent Concern: Stress - Stress Concern Present (02/04/2023)   [employer] of Occupational Health - Occupational Stress Questionnaire    Feeling of Stress : To some extent  Social Connections: Socially Integrated (03/16/2023)   Social Connection and Isolation Panel  [NHANES]    Frequency of Communication with Friends and Family: Twice a week    Frequency of Social Gatherings with Friends and Family: Once a week    Attends Religious Services: More than 4 times per year    Active Member of [employer] or Organizations: Yes    Attends Banker Meetings: 1 to 4 times per year    Marital Status: Married  Catering manager Violence: Not on file   Social History   Tobacco Use  Smoking Status Every Day  Smokeless Tobacco Current   Types: Chew  Tobacco Comments   Chewing tobacco, daily   Social History   Substance and Sexual Activity  Alcohol Use Yes   Alcohol/week: 5.0 standard drinks of alcohol   Types: 5 Shots of liquor per week   Comment: 12 pack on the weekend    Family History:  Family History  Problem Relation Age of Onset   Leukemia Father    Cancer Father    COPD Sister     Past medical history, surgical history, medications, allergies, family history and social history reviewed with patient today and changes made to appropriate areas of the chart.   ROS All other ROS negative except what is listed above and in the HPI.      Objective:    BP 138/82 (BP Location: Left Arm, Patient Position: Sitting, Cuff Size: Large)   Pulse 76   Temp 98.7 F (37.1 C) (Oral)   Ht 6' 0.4" (1.839 m)   Wt 276 lb (125.2 kg)   SpO2 98%   BMI 37.02 kg/m   Wt Readings from Last 3 Encounters:  03/17/23 276 lb (125.2 kg)  11/12/22 270 lb (122.5 kg)  09/19/21 275 lb (124.7 kg)    Physical  Exam   Results for orders placed or performed in visit on 02/24/23  ECHOCARDIOGRAM COMPLETE  Result Value Ref Range   AR max vel 3.95 cm2   AV Peak grad 5.6 mmHg   Ao pk vel 1.18 m/s   Area-P 1/2 2.95 cm2   AV Area VTI 4.00 cm2   AV Mean grad 3.0 mmHg   AV Area mean vel 3.84 cm2   Est EF 55 - 60%       Assessment & Plan:   Problem List Items Addressed This Visit       Cardiovascular and Mediastinum   Aortic dilatation (HCC)    Continue  collaboration with cardiology, noted on imaging 09/21/20.      Relevant Medications   rosuvastatin (CRESTOR) 10 MG tablet   Atrial fibrillation (HCC)    Chronic, ongoing, followed by cardiology.  Scheduled for ablation on 03/20/23.  Continue current medication regimen as ordered by cardiology and collaboration, recent notes and labs reviewed.  This may be exacerbated at times due to heavier alcohol use, which he is aware to continue to reduce.        Relevant Medications   rosuvastatin (CRESTOR) 10 MG tablet   CHF (congestive heart failure), NYHA class II, chronic, systolic (HCC) - Primary    Chronic, ongoing.  Noted on echo 09/21/20.  Euvolemic.  Continue collaboration with cardiology and current medication regimen as ordered by them.  Recommend: - Reminded to call for an overnight weight gain of >2 pounds or a weekly weight gain of >5 pounds - not adding salt to food and read food labels. Reviewed the importance of keeping daily sodium intake to 2000mg  daily. - Avoid Ibuprofen products. - Cut back on alcohol use.      Relevant Medications   rosuvastatin (CRESTOR) 10 MG tablet   Other Relevant Orders   Comprehensive metabolic panel   Lipid Panel w/o Chol/HDL Ratio     Respiratory   OSA (obstructive sleep apnea)    Not using CPAP and returned it to company as did not like mask and could not sleep well. Would benefit from treatment, discussed Earnest Bailey -- he will look into this in future.        Digestive   Hepatic steatosis    Chronic, noted on imaging.  Recommend he cut back on alcohol use at home to help improve liver health.  May need to send to GI in future.      Relevant Orders   Comprehensive metabolic panel     Hematopoietic and Hemostatic   Other thrombophilia (HCC)    On Eliquis and has A-Fib.  Continue to monitor closely for increased bruising or bleeding.  CBC annually.      Relevant Orders   CBC with Differential/Platelet   Thrombocytopenia (HCC)    Stable,  noted on past labs.  Recheck today and monitor closely.      Relevant Orders   CBC with Differential/Platelet     Other   Alcohol use disorder, moderate, dependence (HCC)    Ongoing issue for years with strong family history.  Recheck labs today.  Recommend AA , which he has attended in past with benefit.  Tried Naltrexone in past, but he stopped taking this.      Relevant Orders   Comprehensive metabolic panel   Elevated BP without diagnosis of hypertension    Initial BP elevated and repeat closer to goal, recent with cardiology was well below goal.  Due to A-fib better to check  BP manually.  Recommend focus on DASH diet and modest weight loss at home.  Check BP at home at least 3 mornings a week and document.  Will obtain CMP and TSH today.  Suspect more white coat hypertension, but will monitor and initiate medication as needed.      Relevant Orders   CBC with Differential/Platelet   Comprehensive metabolic panel   Elevated ferritin level   Relevant Orders   CBC with Differential/Platelet   Iron Binding Cap (TIBC)([facility])   Ferritin   Elevated TSH    Is on Amiodarone, will monitor thyroid closely with this.  Labs today and treat as needed.      Relevant Orders   TSH   T4, free   Thyroid peroxidase antibody   Hemochromatosis    Does drink heavily at times.  Recheck labs today and if ongoing or worsening elevations may need to send to hematology.  Highly recommend he cut back on alcohol use.      Relevant Orders   CBC with Differential/Platelet   Iron Binding Cap (TIBC)([facility])   Ferritin   Morbid obesity (HCC)    BMI 37.02 with A-Fib and OSA.  Recommended eating smaller high protein, low fat meals more frequently and exercising 30 mins a day 5 times a week with a goal of 10-15lb weight loss in the next 3 months. Patient voiced their understanding and motivation to adhere to these recommendations.       Other Visit Diagnoses     Benign prostatic  hyperplasia without lower urinary tract symptoms       PSA on labs today.   Relevant Orders   PSA   Flu vaccine need       Flu vaccine today, educated patient.   Encounter for annual physical exam       Annual physical today with labs and health maintenance reviewed, discussed with patient.       Discussed aspirin prophylaxis for myocardial infarction prevention and decision was it was not indicated  LABORATORY TESTING:  Health maintenance labs ordered today as discussed above.   The natural history of prostate cancer and ongoing controversy regarding screening and potential treatment outcomes of prostate cancer has been discussed with the patient. The meaning of a false positive PSA and a false negative PSA has been discussed. He indicates understanding of the limitations of this screening test and wishes to proceed with screening PSA testing.   IMMUNIZATIONS:   - Tdap: Tetanus vaccination status reviewed: last tetanus booster within 10 years. - Influenza:  gets annually via workplace - Pneumovax: Not applicable - Prevnar: Not applicable - Zostavax vaccine: Refused - Covid: has had three vaccinations  SCREENING: - Colonoscopy: Up to date  Discussed with patient purpose of the colonoscopy is to detect colon cancer at curable precancerous or early stages   - AAA Screening: Not applicable  -Hearing Test: Not applicable  -Spirometry: Not applicable   PATIENT COUNSELING:    Sexuality: Discussed sexually transmitted diseases, partner selection, use of condoms, avoidance of unintended pregnancy  and contraceptive alternatives.   Advised to avoid cigarette smoking.  I discussed with the patient that most people either abstain from alcohol or drink within safe limits (<=14/week and <=4 drinks/occasion for males, <=7/weeks and <= 3 drinks/occasion for females) and that the risk for alcohol disorders and other health effects rises proportionally with the number of drinks per week and  how often a drinker exceeds daily limits.  Discussed cessation/primary prevention of drug use and  availability of treatment for abuse.   Diet: Encouraged to adjust caloric intake to maintain  or achieve ideal body weight, to reduce intake of dietary saturated fat and total fat, to limit sodium intake by avoiding high sodium foods and not adding table salt, and to maintain adequate dietary potassium and calcium preferably from fresh fruits, vegetables, and low-fat dairy products.    Stressed the importance of regular exercise  Injury prevention: Discussed safety belts, safety helmets, smoke detector, smoking near bedding or upholstery.   Dental health: Discussed importance of regular tooth brushing, flossing, and dental visits.   Follow up plan: NEXT PREVENTATIVE PHYSICAL DUE IN 1 YEAR. Return in about 6 months (around 09/15/2023) for HTN/HLD/HF, A-FIB. ALCOHOL USE.

## 2023-03-17 NOTE — Assessment & Plan Note (Signed)
Chronic, noted on imaging.  Recommend he cut back on alcohol use at home to help improve liver health.  May need to send to GI in future.

## 2023-03-17 NOTE — Assessment & Plan Note (Signed)
Not using CPAP and returned it to company as did not like mask and could not sleep well. Would benefit from treatment, discussed Joseph [name] -- he will look into this in future.

## 2023-03-17 NOTE — Assessment & Plan Note (Signed)
On Eliquis and has A-Fib.  Continue to monitor closely for increased bruising or bleeding.  CBC annually.

## 2023-03-17 NOTE — Assessment & Plan Note (Signed)
Chronic, ongoing, followed by cardiology.  Scheduled for ablation on 03/20/23.  Continue current medication regimen as ordered by cardiology and collaboration, recent notes and labs reviewed.  This may be exacerbated at times due to heavier alcohol use, which he is aware to continue to reduce.

## 2023-03-17 NOTE — Assessment & Plan Note (Addendum)
Chronic, ongoing.  Noted on echo 09/21/20.  Euvolemic.  Continue collaboration with cardiology and current medication regimen as ordered by them.  Recommend: - Reminded to call for an overnight weight gain of >2 pounds or a weekly weight gain of >5 pounds - not adding salt to food and read food labels. Reviewed the importance of keeping daily sodium intake to 2000mg  daily. - Avoid Ibuprofen products. - Cut back on alcohol use.

## 2023-03-17 NOTE — Assessment & Plan Note (Signed)
Ongoing issue for years with strong family history.  Recheck labs today.  Recommend AA , which he has attended in past with benefit.  Tried Naltrexone in past, but he stopped taking this.

## 2023-03-17 NOTE — Assessment & Plan Note (Signed)
BMI 37.02 with A-Fib and OSA.  Recommended eating smaller high protein, low fat meals more frequently and exercising 30 mins a day 5 times a week with a goal of 10-15lb weight loss in the next 3 months. Patient voiced their understanding and motivation to adhere to these recommendations.

## 2023-03-18 LAB — CBC WITH DIFFERENTIAL/PLATELET
Basophils Absolute: 0 10*3/uL (ref 0.0–0.2)
Basos: 0 %
EOS (ABSOLUTE): 0.1 10*3/uL (ref 0.0–0.4)
Eos: 1 %
Hematocrit: 41.6 % (ref 37.5–51.0)
Hemoglobin: 14 g/dL (ref 13.0–17.7)
Immature Grans (Abs): 0.1 10*3/uL (ref 0.0–0.1)
Immature Granulocytes: 1 %
Lymphocytes Absolute: 1.4 10*3/uL (ref 0.7–3.1)
Lymphs: 17 %
MCH: 34.4 pg — ABNORMAL HIGH (ref 26.6–33.0)
MCHC: 33.7 g/dL (ref 31.5–35.7)
MCV: 102 fL — ABNORMAL HIGH (ref 79–97)
Monocytes Absolute: 0.8 10*3/uL (ref 0.1–0.9)
Monocytes: 9 %
Neutrophils Absolute: 6.3 10*3/uL (ref 1.4–7.0)
Neutrophils: 72 %
Platelets: 157 10*3/uL (ref 150–450)
RBC: 4.07 x10E6/uL — ABNORMAL LOW (ref 4.14–5.80)
RDW: 12.5 % (ref 11.6–15.4)
WBC: 8.6 10*3/uL (ref 3.4–10.8)

## 2023-03-18 LAB — COMPREHENSIVE METABOLIC PANEL
ALT: 22 [IU]/L (ref 0–44)
AST: 33 [IU]/L (ref 0–40)
Albumin: 4 g/dL (ref 3.9–4.9)
Alkaline Phosphatase: 67 [IU]/L (ref 44–121)
BUN/Creatinine Ratio: 12 (ref 10–24)
BUN: 11 mg/dL (ref 8–27)
Bilirubin Total: 0.8 mg/dL (ref 0.0–1.2)
CO2: 24 mmol/L (ref 20–29)
Calcium: 8.6 mg/dL (ref 8.6–10.2)
Chloride: 98 mmol/L (ref 96–106)
Creatinine, Ser: 0.9 mg/dL (ref 0.76–1.27)
Globulin, Total: 2.1 g/dL (ref 1.5–4.5)
Glucose: 86 mg/dL (ref 70–99)
Potassium: 3.5 mmol/L (ref 3.5–5.2)
Sodium: 139 mmol/L (ref 134–144)
Total Protein: 6.1 g/dL (ref 6.0–8.5)
eGFR: 97 mL/min/{1.73_m2} (ref >59)

## 2023-03-18 LAB — IRON AND TIBC
Iron Saturation: 88 % (ref 15–55)
Iron: 261 ug/dL (ref 38–169)
Total Iron Binding Capacity: 295 ug/dL (ref 250–450)
UIBC: 34 ug/dL — ABNORMAL LOW (ref 111–343)

## 2023-03-18 LAB — TSH: TSH: 2.04 u[IU]/mL (ref 0.450–4.500)

## 2023-03-18 LAB — PSA: Prostate Specific Ag, Serum: 1.7 ng/mL (ref 0.0–4.0)

## 2023-03-18 LAB — T4, FREE: Free T4: 1.44 ng/dL (ref 0.82–1.77)

## 2023-03-18 LAB — THYROID PEROXIDASE ANTIBODY: Thyroperoxidase Ab SerPl-aCnc: <9 [IU]/mL (ref 0–34)

## 2023-03-18 LAB — LIPID PANEL W/O CHOL/HDL RATIO
Cholesterol, Total: 128 mg/dL (ref 100–199)
HDL: 60 mg/dL (ref >39)
LDL Chol Calc (NIH): 54 mg/dL (ref 0–99)
Triglycerides: 69 mg/dL (ref 0–149)
VLDL Cholesterol Cal: 14 mg/dL (ref 5–40)

## 2023-03-18 LAB — FERRITIN: Ferritin: 926 ng/mL — ABNORMAL HIGH (ref 30–400)

## 2023-03-18 NOTE — Progress Notes (Signed)
Contacted via MyChart -- may need to call, he does not always check   Good evening Rasaan, your labs have returned: - CBC shows stable hemoglobin and hematocrit, but your iron level and ferritin remain quite elevated.  You did not see hematology when referral placed last year.  I recommend we place a new referral and you see them for further work-up on this + cut back on alcohol use.  Are you okay with this referral? - Remainder of labs look great.  No changes needed.  Any questions? Keep being stellar!!  Thank you for allowing me to participate in your care.  I appreciate you. Kindest regards, Gus Littler

## 2023-03-19 ENCOUNTER — Other Ambulatory Visit: Payer: Self-pay | Admitting: Nurse Practitioner

## 2023-03-19 DIAGNOSIS — R7989 Other specified abnormal findings of blood chemistry: Secondary | ICD-10-CM

## 2023-03-19 NOTE — Pre-Procedure Instructions (Signed)
Instructed patient on the following items: Arrival time 0800 Nothing to eat or drink after midnight No meds AM of procedure Responsible person to drive you home and stay with you for 24 hrs  Have you missed any doses of anti-coagulant Eliquis-takes twice a day, hasn't missed any doses.  Don't take dose in the morning.

## 2023-03-20 ENCOUNTER — Other Ambulatory Visit: Payer: Self-pay

## 2023-03-20 ENCOUNTER — Encounter (HOSPITAL_COMMUNITY)
Admission: RE | Disposition: A | Discharge status: Home / Self Care | Payer: Self-pay | Source: Home / Self Care | Attending: Cardiology

## 2023-03-20 ENCOUNTER — Other Ambulatory Visit (HOSPITAL_COMMUNITY): Payer: Self-pay

## 2023-03-20 ENCOUNTER — Ambulatory Visit (HOSPITAL_COMMUNITY): Payer: BC Managed Care – PPO

## 2023-03-20 ENCOUNTER — Telehealth (HOSPITAL_COMMUNITY): Payer: Self-pay | Admitting: Pharmacy Technician

## 2023-03-20 ENCOUNTER — Ambulatory Visit (HOSPITAL_COMMUNITY)
Admission: RE | Admit: 2023-03-20 | Discharge: 2023-03-20 | Disposition: A | Discharge status: Home / Self Care | Payer: BC Managed Care – PPO | Attending: Cardiology | Admitting: Cardiology

## 2023-03-20 DIAGNOSIS — I4891 Unspecified atrial fibrillation: Secondary | ICD-10-CM | POA: Diagnosis not present

## 2023-03-20 DIAGNOSIS — Z7901 Long term (current) use of anticoagulants: Secondary | ICD-10-CM | POA: Insufficient documentation

## 2023-03-20 DIAGNOSIS — R7989 Other specified abnormal findings of blood chemistry: Secondary | ICD-10-CM | POA: Insufficient documentation

## 2023-03-20 DIAGNOSIS — I4819 Other persistent atrial fibrillation: Secondary | ICD-10-CM | POA: Insufficient documentation

## 2023-03-20 DIAGNOSIS — I5022 Chronic systolic (congestive) heart failure: Secondary | ICD-10-CM | POA: Insufficient documentation

## 2023-03-20 DIAGNOSIS — Z79899 Other long term (current) drug therapy: Secondary | ICD-10-CM | POA: Insufficient documentation

## 2023-03-20 HISTORY — PX: ATRIAL FIBRILLATION ABLATION: EP1191

## 2023-03-20 LAB — POCT ACTIVATED CLOTTING TIME: Activated Clotting Time: 317 s

## 2023-03-20 SURGERY — ATRIAL FIBRILLATION ABLATION
Anesthesia: General

## 2023-03-20 MED ORDER — APIXABAN 5 MG PO TABS
5.0000 mg | ORAL_TABLET | Freq: Two times a day (BID) | ORAL | Status: DC
  Administered 2023-03-20: 5 mg via ORAL
  Filled 2023-03-20: qty 1

## 2023-03-20 MED ORDER — PANTOPRAZOLE SODIUM 40 MG PO TBEC
40.0000 mg | DELAYED_RELEASE_TABLET | Freq: Every day | ORAL | Status: DC
  Administered 2023-03-20: 40 mg via ORAL
  Filled 2023-03-20: qty 1

## 2023-03-20 MED ORDER — FENTANYL CITRATE (PF) 250 MCG/5ML IJ SOLN
INTRAMUSCULAR | Status: DC | PRN
  Administered 2023-03-20: 50 ug via INTRAVENOUS
  Administered 2023-03-20: 100 ug via INTRAVENOUS

## 2023-03-20 MED ORDER — SODIUM CHLORIDE 0.9% FLUSH: 3.0000 mL | Freq: Two times a day (BID) | INTRAVENOUS | Status: DC

## 2023-03-20 MED ORDER — PHENYLEPHRINE 80 MCG/ML (10ML) SYRINGE FOR IV PUSH (FOR BLOOD PRESSURE SUPPORT)
PREFILLED_SYRINGE | INTRAVENOUS | Status: DC | PRN
  Administered 2023-03-20 (×4): 80 ug via INTRAVENOUS

## 2023-03-20 MED ORDER — SODIUM CHLORIDE 0.9% FLUSH: 3.0000 mL | INTRAVENOUS | Status: DC | PRN

## 2023-03-20 MED ORDER — PROPOFOL 10 MG/ML IV BOLUS
INTRAVENOUS | Status: DC | PRN
  Administered 2023-03-20: 30 mg via INTRAVENOUS
  Administered 2023-03-20: 170 mg via INTRAVENOUS

## 2023-03-20 MED ORDER — METOPROLOL SUCCINATE ER 25 MG PO TB24
25.0000 mg | ORAL_TABLET | Freq: Once | ORAL | Status: AC
  Administered 2023-03-20: 25 mg via ORAL
  Filled 2023-03-20: qty 1

## 2023-03-20 MED ORDER — PANTOPRAZOLE SODIUM 40 MG PO TBEC
40.0000 mg | DELAYED_RELEASE_TABLET | Freq: Every day | ORAL | 0 refills | Status: DC
Start: 2023-03-20 — End: 2023-10-17
  Filled 2023-03-20: qty 45, 45d supply, fill #0

## 2023-03-20 MED ORDER — COLCHICINE 0.6 MG PO TABS
0.6000 mg | ORAL_TABLET | Freq: Two times a day (BID) | ORAL | Status: DC
  Administered 2023-03-20: 0.6 mg via ORAL
  Filled 2023-03-20 (×2): qty 1

## 2023-03-20 MED ORDER — PROPOFOL 500 MG/50ML IV EMUL
INTRAVENOUS | Status: DC | PRN
Start: 2023-03-20 — End: 2023-03-20
  Administered 2023-03-20: 50 ug/kg/min via INTRAVENOUS

## 2023-03-20 MED ORDER — HEPARIN (PORCINE) IN NACL 1000-0.9 UT/500ML-% IV SOLN
INTRAVENOUS | Status: DC | PRN
  Administered 2023-03-20 (×3): 500 mL

## 2023-03-20 MED ORDER — LIDOCAINE 2% (20 MG/ML) 5 ML SYRINGE
INTRAMUSCULAR | Status: DC | PRN
  Administered 2023-03-20: 100 mg via INTRAVENOUS

## 2023-03-20 MED ORDER — HEPARIN SODIUM (PORCINE) 1000 UNIT/ML IJ SOLN
INTRAMUSCULAR | Status: AC
  Filled 2023-03-20: qty 10

## 2023-03-20 MED ORDER — ROCURONIUM BROMIDE 10 MG/ML (PF) SYRINGE
PREFILLED_SYRINGE | INTRAVENOUS | Status: DC | PRN
  Administered 2023-03-20: 70 mg via INTRAVENOUS
  Administered 2023-03-20: 30 mg via INTRAVENOUS
  Administered 2023-03-20: 20 mg via INTRAVENOUS

## 2023-03-20 MED ORDER — ONDANSETRON HCL 4 MG/2ML IJ SOLN: 4.0000 mg | Freq: Four times a day (QID) | INTRAMUSCULAR | Status: DC | PRN

## 2023-03-20 MED ORDER — MIDAZOLAM HCL 2 MG/2ML IJ SOLN
INTRAMUSCULAR | Status: DC | PRN
  Administered 2023-03-20: 2 mg via INTRAVENOUS

## 2023-03-20 MED ORDER — SUGAMMADEX SODIUM 200 MG/2ML IV SOLN
INTRAVENOUS | Status: DC | PRN
  Administered 2023-03-20: 300 mg via INTRAVENOUS

## 2023-03-20 MED ORDER — COLCHICINE 0.6 MG PO TABS
0.6000 mg | ORAL_TABLET | Freq: Two times a day (BID) | ORAL | 0 refills | Status: DC
  Filled 2023-03-20: qty 10, 5d supply, fill #0

## 2023-03-20 MED ORDER — ONDANSETRON HCL 4 MG/2ML IJ SOLN
INTRAMUSCULAR | Status: DC | PRN
  Administered 2023-03-20: 4 mg via INTRAVENOUS

## 2023-03-20 MED ORDER — SODIUM CHLORIDE 0.9 % IV SOLN: INTRAVENOUS | Status: DC

## 2023-03-20 MED ORDER — ACETAMINOPHEN 325 MG PO TABS: 650.0000 mg | ORAL_TABLET | ORAL | Status: DC | PRN

## 2023-03-20 MED ORDER — PROTAMINE SULFATE 10 MG/ML IV SOLN
INTRAVENOUS | Status: DC | PRN
Start: 2023-03-20 — End: 2023-03-20
  Administered 2023-03-20: 35 mg via INTRAVENOUS
  Administered 2023-03-20: 10 mg via INTRAVENOUS

## 2023-03-20 MED ORDER — DEXAMETHASONE SODIUM PHOSPHATE 10 MG/ML IJ SOLN
INTRAMUSCULAR | Status: DC | PRN
  Administered 2023-03-20: 10 mg via INTRAVENOUS

## 2023-03-20 MED ORDER — FENTANYL CITRATE (PF) 100 MCG/2ML IJ SOLN: 25.0000 ug | INTRAMUSCULAR | Status: DC | PRN

## 2023-03-20 MED ORDER — HEPARIN SODIUM (PORCINE) 1000 UNIT/ML IJ SOLN
INTRAMUSCULAR | Status: DC | PRN
Start: 2023-03-20 — End: 2023-03-20
  Administered 2023-03-20: 4000 [IU] via INTRAVENOUS
  Administered 2023-03-20: 18000 [IU] via INTRAVENOUS

## 2023-03-20 MED ORDER — ATROPINE SULFATE 0.4 MG/ML IV SOLN
INTRAVENOUS | Status: DC | PRN
Start: 2023-03-20 — End: 2023-03-20
  Administered 2023-03-20: 1 mg via INTRAVENOUS

## 2023-03-20 SURGICAL SUPPLY — 20 items
CABLE PFA RX CATH CONN (CABLE) IMPLANT
CATH 8FR REPROCESSED SOUNDSTAR (CATHETERS) ×1 IMPLANT
CATH 8FR SOUNDSTAR REPROCESSED (CATHETERS) IMPLANT
CATH FARAWAVE ABLATION 31 (CATHETERS) IMPLANT
CATH MAPPNG PENTARAY F 2-6-2MM (CATHETERS) IMPLANT
CATH WEB BI DIR CSDF CRV REPRO (CATHETERS) IMPLANT
CLOSURE PERCLOSE PROSTYLE (VASCULAR PRODUCTS) IMPLANT
COVER SWIFTLINK CONNECTOR (BAG) ×1 IMPLANT
DILATOR VESSEL 38 20CM 16FR (INTRODUCER) IMPLANT
GUIDEWIRE INQWIRE 1.5J.035X260 (WIRE) IMPLANT
INQWIRE 1.5J .035X260CM (WIRE) ×1
PACK EP LATEX FREE (CUSTOM PROCEDURE TRAY) ×1
PACK EP LF (CUSTOM PROCEDURE TRAY) ×1 IMPLANT
PAD DEFIB RADIO PHYSIO CONN (PAD) ×1 IMPLANT
PENTARAY F 2-6-2MM (CATHETERS) ×1
SHEATH FARADRIVE STEERABLE (SHEATH) IMPLANT
SHEATH PINNACLE 8F 10CM (SHEATH) IMPLANT
SHEATH PINNACLE 9F 10CM (SHEATH) IMPLANT
SHEATH PROBE COVER 6X72 (BAG) IMPLANT
SHEATH WIRE KIT BAYLIS SL1 (KITS) IMPLANT

## 2023-03-20 NOTE — Anesthesia Postprocedure Evaluation (Signed)
Anesthesia Post Note  Patient: Joseph [name]  Procedure(s) Performed: ATRIAL FIBRILLATION ABLATION     Patient location during evaluation: PACU Anesthesia Type: General Level of consciousness: awake and alert Pain management: pain level controlled Vital Signs Assessment: post-procedure vital signs reviewed and stable Respiratory status: spontaneous breathing, nonlabored ventilation and respiratory function stable Cardiovascular status: blood pressure returned to baseline Postop Assessment: no apparent nausea or vomiting Anesthetic complications: no   No notable events documented.  Last Vitals:  Vitals:   03/20/23 0835 03/20/23 1220  BP: 129/83   Pulse: (!) 56 (!) 59  Resp: 14 15  Temp: 36.6 C 36.7 C  SpO2: 95%     Last Pain:  Vitals:   03/20/23 1220  TempSrc: Temporal  PainSc: 0-No pain                 Shanda Howells

## 2023-03-20 NOTE — Telephone Encounter (Signed)
Pharmacy Patient Advocate Encounter   Received notification that prior authorization for Pantoprazole Sodium 40MG  dr tablets is required/requested.   Insurance verification completed.   The patient is insured through [facility] .   Per test claim: PA required; PA started via CoverMyMeds. KEY [id number] . Waiting for clinical questions to populate.

## 2023-03-20 NOTE — Anesthesia Preprocedure Evaluation (Addendum)
Anesthesia Evaluation  Patient identified by MRN, date of birth, ID band Patient awake    Reviewed: Allergy & Precautions, NPO status , Patient's Chart, lab work & pertinent test results, reviewed documented beta blocker date and time   History of Anesthesia Complications Negative for: history of anesthetic complications  Airway Mallampati: II  TM Distance: >3 FB Neck ROM: Full    Dental  (+) Missing,    Pulmonary sleep apnea , Current Smoker   Pulmonary exam normal        Cardiovascular Pt. on home beta blockers Normal cardiovascular exam+ dysrhythmias (on Eliquis) Atrial Fibrillation      Neuro/Psych negative neurological ROS     GI/Hepatic Neg liver ROS, hiatal hernia,,,  Endo/Other  negative endocrine ROS    Renal/GU negative Renal ROS     Musculoskeletal  (+) Arthritis ,    Abdominal   Peds  Hematology negative hematology ROS (+)   Anesthesia Other Findings Day of surgery medications reviewed with patient.  Reproductive/Obstetrics                              Anesthesia Physical Anesthesia Plan  ASA: 3  Anesthesia Plan: General   Post-op Pain Management: Minimal or no pain anticipated   Induction: Intravenous  PONV Risk Score and Plan: Treatment may vary due to age or medical condition, Ondansetron, Dexamethasone and Midazolam  Airway Management Planned: Oral ETT  Additional Equipment:   Intra-op Plan:   Post-operative Plan: Extubation in OR  Informed Consent: I have reviewed the patients History and Physical, chart, labs and discussed the procedure including the risks, benefits and alternatives for the proposed anesthesia with the patient or authorized representative who has indicated his/her understanding and acceptance.     Dental advisory given  Plan Discussed with: CRNA  Anesthesia Plan Comments:          Anesthesia Quick Evaluation

## 2023-03-20 NOTE — Progress Notes (Signed)
  Pt had suture placed after procedure due to continued oozing.   Suture removed without incident. Gauze and small pressure bandage replaced and bed rest increased to 1630 per Dr Meredeth Ide "Mardelle Matte" [place], [place]

## 2023-03-20 NOTE — Telephone Encounter (Signed)
 Clinical Questions have been submitted

## 2023-03-20 NOTE — Discharge Instructions (Signed)

## 2023-03-20 NOTE — Telephone Encounter (Signed)
Pharmacy Patient Advocate Encounter  Received notification from [facility] that Prior Authorization for Pantoprazole Sodium 40MG  dr tablets  has been APPROVED from 03/20/2023 to 03/19/2024   PA #/Case ID/Reference #: [id number]

## 2023-03-20 NOTE — Transfer of Care (Signed)
Immediate Anesthesia Transfer of Care Note  Patient: Anders Hohmann  Procedure(s) Performed: ATRIAL FIBRILLATION ABLATION  Patient Location: PACU and Cath Lab  Anesthesia Type:General  Level of Consciousness: awake, alert , and oriented  Airway & Oxygen Therapy: Patient Spontanous Breathing and Patient connected to face mask oxygen  Post-op Assessment: Report given to RN and Post -op Vital signs reviewed and stable  Post vital signs: Reviewed and stable  Last Vitals:  Vitals Value Taken Time  BP    Temp    Pulse    Resp    SpO2      Last Pain:  Vitals:   03/20/23 0846  TempSrc:   PainSc: 0-No pain         Complications: No notable events documented.

## 2023-03-20 NOTE — H&P (Signed)
Electrophysiology Office Follow up Visit Note:     Date:  03/20/2023    ID:  Joseph [name], DOB Sep 18, 1960, MRN [id number]   PCP:  Marjie Skiff, NP             [facility] Cardiologist:  None  [facility] Electrophysiologist:  Lanier Prude, MD      Interval History:     Joseph [name] is a 62 y.o. male who presents for a follow up visit.    I have previously met the patient regarding his atrial fibrillation.  He was scheduled for catheter ablation but this was canceled due to a death in his family.   He is back in clinic and wants to get back on track with treating his heart failure and atrial fibrillation.  He had a difficult year with the passing of his mother.  No swelling.  Intermittently will feel his heart race.    Presents for PVI today. Procedure reviewed.    Objective Past medical, surgical, social and family history were reviewed.   ROS:   Please see the history of present illness.    All other systems reviewed and are negative.   EKGs/Labs/Other Studies Reviewed:     The following studies were reviewed today:   EKG September 11, 2021 shows sinus rhythm   Labs show elevated AST/ALT, significantly elevated ferritin and iron saturation.   EKG:  The ekg ordered today demonstrates sinus rhythm.  PVC.     Physical Exam:     VS:  BP 129/83   Pulse 56   Ht 6' (1.829 m)   Wt 270 lb (122.5 kg)   SpO2 93%   BMI 36.62 kg/m         Wt Readings from Last 3 Encounters:  11/12/22 270 lb (122.5 kg)  09/19/21 275 lb (124.7 kg)  09/11/21 281 lb (127.5 kg)      GEN:  Well nourished, well developed in no acute distress CARDIAC: RRR, no murmurs, rubs, gallops RESPIRATORY:  Clear to auscultation without rales, wheezing or rhonchi          Assessment ASSESSMENT:     1. Persistent atrial fibrillation (HCC)   2. Chronic systolic heart failure (HCC)   3. Elevated ferritin     PLAN:     In order of problems listed above:      #Persistent atrial fibrillation #High risk med monitoring-amiodarone Symptomatic.  Associated with chronic systolic heart failure. Continue amiodarone and Eliquis   Discussed treatment options today for AF including antiarrhythmic drug therapy and ablation. Discussed risks, recovery and likelihood of success with each treatment strategy. Risk, benefits, and alternatives to EP study and radiofrequency ablation for afib were discussed. These risks include but are not limited to stroke, bleeding, vascular damage, tamponade, perforation, damage to the esophagus, lungs, phrenic nerve and other structures, pulmonary vein stenosis, worsening renal function, and death.  Discussed potential need for repeat ablation procedures and antiarrhythmic drugs after an initial ablation. The patient understands these risk and wishes to proceed.  We will therefore proceed with catheter ablation at the next available time.  Carto, ICE, anesthesia are requested for the procedure.  Will also obtain CT PV protocol prior to the procedure to exclude LAA thrombus and further evaluate atrial anatomy.   Patient will need an echo prior to the procedure   Needs updated CMP, TSH and free T4 for amiodarone monitoring.  Given his abnormal blood work I will reduce his dose of  amiodarone to 100 mg by mouth once daily.   #Chronic systolic heart failure NYHA class II.  Last ejection fraction in 2022 showed an EF of 30 to 35%.   #Elevated ferritin, iron saturation, transaminases Concerning for hemochromatosis.  I wonder if his systolic heart failure may be related.  Unguinal order a cardiac MRI to further assess for infiltrative cardiomyopathy.    Presents for PVI today. Procedure reviewed.   Signed, Steffanie Dunn, MD, [facility], [facility] 03/20/2023 Electrophysiology [facility]

## 2023-03-20 NOTE — Anesthesia Procedure Notes (Signed)
Procedure Name: Intubation Date/Time: 03/20/2023 10:32 AM  Performed by: Vena Austria, CRNAPre-anesthesia Checklist: Patient identified, Emergency Drugs available, Suction available, Patient being monitored and Timeout performed Oxygen Delivery Method: Circle system utilized Preoxygenation: Pre-oxygenation with 100% oxygen Induction Type: IV induction Ventilation: Mask ventilation without difficulty Laryngoscope Size: McGraph and 3 Grade View: Grade I Tube type: Oral Tube size: 7.0 mm Number of attempts: 1 Placement Confirmation: ETT inserted through vocal cords under direct vision, positive ETCO2, CO2 detector and breath sounds checked- equal and bilateral Secured at: 22 cm Tube secured with: Tape Dental Injury: Injury to lip

## 2023-03-21 ENCOUNTER — Encounter: Payer: Self-pay | Admitting: Cardiology

## 2023-03-22 ENCOUNTER — Other Ambulatory Visit: Payer: Self-pay | Admitting: Student

## 2023-03-23 ENCOUNTER — Encounter (HOSPITAL_COMMUNITY): Payer: Self-pay | Admitting: Cardiology

## 2023-03-27 ENCOUNTER — Encounter: Payer: Self-pay | Admitting: Nurse Practitioner

## 2023-04-03 ENCOUNTER — Ambulatory Visit (HOSPITAL_COMMUNITY): Payer: BC Managed Care – PPO | Admitting: Physician Assistant

## 2023-04-14 ENCOUNTER — Ambulatory Visit (HOSPITAL_COMMUNITY): Payer: BC Managed Care – PPO | Admitting: Physician Assistant

## 2023-04-14 ENCOUNTER — Encounter (HOSPITAL_COMMUNITY): Payer: Self-pay

## 2023-04-24 ENCOUNTER — Ambulatory Visit (HOSPITAL_COMMUNITY): Payer: BC Managed Care – PPO | Admitting: Physician Assistant

## 2023-04-26 ENCOUNTER — Other Ambulatory Visit: Payer: Self-pay | Admitting: Cardiology

## 2023-04-27 NOTE — Telephone Encounter (Signed)
Prescription refill request for Eliquis received. Indication:afib Last office visit:6/24 Scr:0.90  10/24 Age: 62 Weight:124.7  kg  Prescription refilled

## 2023-04-28 ENCOUNTER — Other Ambulatory Visit (HOSPITAL_COMMUNITY): Payer: Self-pay

## 2023-04-28 ENCOUNTER — Encounter (HOSPITAL_COMMUNITY): Payer: Self-pay

## 2023-04-28 ENCOUNTER — Ambulatory Visit (HOSPITAL_COMMUNITY): Payer: BC Managed Care – PPO | Admitting: Physician Assistant

## 2023-05-14 ENCOUNTER — Other Ambulatory Visit: Payer: Self-pay | Admitting: Cardiology

## 2023-05-24 NOTE — Progress Notes (Unsigned)
Cardiology Clinic Note    Date:  05/24/2023  Patient ID:  Joseph [name], DOB 1961/06/02, MRN [id number] PCP:  Marjie Skiff, NP  Cardiologist:  None Electrophysiologist: Lanier Prude, MD  ***refresh   Patient Profile    Chief Complaint: afib follow-up  History of Present Illness: Joseph [name] is a 62 y.o. male with PMH notable for persis AFib, HFrEF, Elevated LFTs; seen today for Lanier Prude, MD for routine electrophysiology followup.  He is s/p afib ablation 03/20/2023 with PVI, posterior wall by Dr. Lalla Brothers. Labwork has been concerning for hemochromatosis with elevated ferritin, iron sat, and transaminases, and Dr. Lalla Brothers was concerned this was related to his CHF. He ordered a cMRI, but this has not been completed.    On follow-up today,  *** AF burden, symptoms *** palpitations *** bleeding concerns    Since last being seen in our clinic the patient reports doing ***.  he denies chest pain, palpitations, dyspnea, PND, orthopnea, nausea, vomiting, dizziness, syncope, edema, weight gain, or early satiety.     Device Information: ***  AAD History: Amiodarone     ROS:  Please see the history of present illness. All other systems are reviewed and otherwise negative.    Physical Exam    VS:  There were no vitals taken for this visit. BMI: There is no height or weight on file to calculate BMI.  Wt Readings from Last 3 Encounters:  03/20/23 275 lb (124.7 kg)  03/17/23 276 lb (125.2 kg)  11/12/22 270 lb (122.5 kg)     GEN- The patient is well appearing, alert and oriented x 3 today.   Lungs- Clear to ausculation bilaterally, normal work of breathing.  Heart- {Blank single:19197::"Regular","Irregularly irregular"} rate and rhythm, no murmurs, rubs or gallops Extremities- {EDEMA LEVEL:28147::"No"} peripheral edema, warm, dry   Studies Reviewed   Previous EP, cardiology notes.    EKG is ordered. Personal review of EKG from today  shows:  ***        Cardiac CT, 02/27/2023 1. There is normal pulmonary vein drainage into the left atrium. (2 on the right and 2 on the left)  2. The left atrial appendage is a chicken wing type with ostial size 30 x 20 mm and length 41 mm, Area 46 mm2. There is no thrombus in the left atrial appendage.  3. The esophagus runs in the left atrial midline and is not in the proximity to any of the pulmonary veins.  4. Coronary calcium score of 42.8. This is 50th percentile for age and gender matched controls.  Chest CT over-read, 02/27/2023 1. 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 Recommend semi-annual imaging followup by CTA or MRA and referral to cardiothoracic surgery if not already obtained. This recommendation follows 2010 ACCF/AHA/AATS/ACR/ASA/SCA/SCAI/SIR/STS/SVM Guidelines for the Diagnosis and Management of Patients With Thoracic Aortic Disease. Circulation. 2010; 121: J811-B147. Aortic aneurysm NOS (ICD10-I71.9)  TTE, 02/24/2023  1. Left ventricular ejection fraction, by estimation, is 55 to 60%. The left ventricle has normal function. The left ventricle has no regional wall motion abnormalities. Left ventricular diastolic parameters are consistent with Grade I diastolic dysfunction (impaired relaxation).   2. Right ventricular systolic function is normal. The right ventricular size is normal. Tricuspid regurgitation signal is inadequate for assessing PA pressure.   3. Left atrial size was moderately dilated.   4. The mitral valve is normal in structure. No evidence of mitral  valve regurgitation. No evidence of mitral stenosis.   5. The aortic valve has an indeterminant number of cusps. Aortic valve regurgitation is not visualized. No aortic stenosis is present.   6. There is moderate dilatation of the aortic root, measuring 48 mm. There is moderate dilatation of the ascending aorta, measuring 47 mm.   7. The inferior vena  cava is normal in size with greater than 50% respiratory variability, suggesting right atrial pressure of 3 mmHg.   TTE, 09/21/2020  1. Left ventricular ejection fraction, by estimation, is 30 to 35%. The left ventricle has moderate to severely decreased function. The left ventricle demonstrates global hypokinesis. There is mild left ventricular hypertrophy. Left ventricular diastolic parameters are indeterminate.   2. Right ventricular systolic function is normal. The right ventricular size is normal.   3. Left atrial size was mildly dilated.   4. The mitral valve is grossly normal. No evidence of mitral valve regurgitation.   5. The aortic valve was not well visualized. Aortic valve regurgitation is not visualized. Mild aortic valve sclerosis is present, with no evidence of aortic valve stenosis.   6. Aortic dilatation noted. There is moderate dilatation of the ascending aorta, measuring 48 mm. There is mild dilatation of the aortic root, measuring 39 mm.   7. The inferior vena cava is normal in size with greater than 50% respiratory variability, suggesting right atrial pressure of 3 mmHg.   Comparison(s): ASD was not demonstrated on this exam.    Assessment and Plan     #) ***   #) ***   {Are you ordering a CV Procedure (e.g. stress test, cath, DCCV, TEE, etc)?   Press F2        :[id number]}   Current medicines are reviewed at length with the patient today.   The patient {ACTIONS; HAS/DOES NOT HAVE:19233} concerns regarding his medicines.  The following changes were made today:  {NONE DEFAULTED:18576}  Labs/ tests ordered today include: *** No orders of the defined types were placed in this encounter.    Disposition: Follow up with {EPMDS:28135} or EP APP {EPFOLLOW UP:28173}   Signed, Sherie Don, NP  05/24/23  4:14 PM  Electrophysiology [facility]

## 2023-05-25 ENCOUNTER — Ambulatory Visit: Payer: BC Managed Care – PPO | Admitting: Cardiology

## 2023-06-08 ENCOUNTER — Ambulatory Visit: Payer: BC Managed Care – PPO | Admitting: Cardiology

## 2023-08-03 DIAGNOSIS — M170 Bilateral primary osteoarthritis of knee: Secondary | ICD-10-CM | POA: Diagnosis not present

## 2023-08-11 ENCOUNTER — Ambulatory Visit: Payer: BC Managed Care – PPO | Admitting: Cardiology

## 2023-08-17 ENCOUNTER — Encounter: Payer: Self-pay | Admitting: Nurse Practitioner

## 2023-09-06 ENCOUNTER — Encounter: Payer: Self-pay | Admitting: Nurse Practitioner

## 2023-09-06 DIAGNOSIS — E785 Hyperlipidemia, unspecified: Secondary | ICD-10-CM

## 2023-09-06 HISTORY — DX: Hyperlipidemia, unspecified: E78.5

## 2023-09-15 ENCOUNTER — Ambulatory Visit: Payer: Self-pay | Admitting: Nurse Practitioner

## 2023-09-15 DIAGNOSIS — D6869 Other thrombophilia: Secondary | ICD-10-CM

## 2023-09-15 DIAGNOSIS — I77819 Aortic ectasia, unspecified site: Secondary | ICD-10-CM

## 2023-09-15 DIAGNOSIS — I5022 Chronic systolic (congestive) heart failure: Secondary | ICD-10-CM

## 2023-09-15 DIAGNOSIS — I4819 Other persistent atrial fibrillation: Secondary | ICD-10-CM

## 2023-09-15 DIAGNOSIS — G4733 Obstructive sleep apnea (adult) (pediatric): Secondary | ICD-10-CM

## 2023-09-15 DIAGNOSIS — E785 Hyperlipidemia, unspecified: Secondary | ICD-10-CM

## 2023-09-15 DIAGNOSIS — R7989 Other specified abnormal findings of blood chemistry: Secondary | ICD-10-CM

## 2023-09-15 DIAGNOSIS — R030 Elevated blood-pressure reading, without diagnosis of hypertension: Secondary | ICD-10-CM

## 2023-09-15 DIAGNOSIS — D696 Thrombocytopenia, unspecified: Secondary | ICD-10-CM

## 2023-09-15 DIAGNOSIS — F1020 Alcohol dependence, uncomplicated: Secondary | ICD-10-CM

## 2023-09-21 ENCOUNTER — Encounter: Payer: Self-pay | Admitting: Cardiology

## 2023-09-22 ENCOUNTER — Telehealth: Payer: Self-pay | Admitting: Cardiology

## 2023-09-22 NOTE — Telephone Encounter (Signed)
   Pre-operative Risk Assessment    Patient Name: Joseph [name]  DOB: 1960/12/21 MRN: [id number]   Date of last office visit: unknown Date of next office visit: unknown   Request for Surgical Clearance    Procedure:   rt tra cort  Date of Surgery:  Clearance 09/30/23                                Surgeon:  Dr. Mathews Solomons Surgeon's Group or Practice Name:  [facility] Phone number:  [phone] Fax number:  [phone]   Type of Clearance Requested:   f ollow instruction   Type of Anesthesia:  Local    Additional requests/questions:    SignedGeroldine Kotyk   09/22/2023, 11:44 AM

## 2023-09-22 NOTE — Telephone Encounter (Signed)
   Name: Joseph [name]  DOB: January 06, 1961  MRN: [id number]  Primary Cardiologist: None  Chart reviewed as part of pre-operative protocol coverage. Because of [name]'s past medical history and time since last visit, he will require a follow-up in-office visit in order to better assess preoperative cardiovascular risk.  Patient has yet to follow-up post atrial fibrillation ablation.  Looking through chart review looks like he has canceled several appointments for follow-up.  Pre-op covering staff: - Please schedule appointment and call patient to inform them. If patient already had an upcoming appointment within acceptable timeframe, please add "pre-op clearance" to the appointment notes so provider is aware. - Please contact requesting surgeon's office via preferred method (i.e, phone, fax) to inform them of need for appointment prior to surgery.  This message will also be routed to pharmacy pool for input on holding Eliquis as requested below so that this information is available to the clearing provider at time of patient's appointment.   Ava Boatman, NP  09/22/2023, 2:18 PM

## 2023-09-22 NOTE — Telephone Encounter (Signed)
 Patient with diagnosis of afib on Eliquis for anticoagulation.    Procedure: right total knee arthroplasty  Date of procedure: 09/30/23   He is >3 months post ablation  CHA2DS2-VASc Score = 1   This indicates a 0.6% annual risk of stroke. The patient's score is based upon: CHF History: 1 HTN History: 0 Diabetes History: 0 Stroke History: 0 Vascular Disease History: 0 Age Score: 0 Gender Score: 0       CrCl 114 ml/min Platelet count 157   Per office protocol, patient can hold Eliquis for 3 days prior to procedure.    **This guidance is not considered finalized until pre-operative APP has relayed final recommendations.**

## 2023-09-22 NOTE — Telephone Encounter (Signed)
 Called requesting office patient is getting a right total knee arthroplasty with local anesthesia clearance type medical and pharmacy patient is taking Eliquis

## 2023-09-23 ENCOUNTER — Telehealth: Payer: Self-pay

## 2023-09-23 NOTE — Telephone Encounter (Signed)
 Attempted to contact patient to schedule pre-op clearance for 4/23 surgery - no answer, [email or web address], as well as MyChart message sent. Patient advised in MyChart message that we can work him in on Monday 4/21 in the [place] office with EP APP.

## 2023-09-23 NOTE — Telephone Encounter (Signed)
 I will send fax to requesting office the pt needs to call back to schedule an appt in office for preop clearance.   Our scheduling team has left a message to call back as they can get him added on 09/28/23.

## 2023-09-23 NOTE — Telephone Encounter (Signed)
   Pre-operative Risk Assessment    Patient Name: Joseph [name]  DOB: [birth year] MRN: [id number]   Date of last office visit: 02/24/2023 Date of next office visit: Not scheduled   Request for Surgical Clearance    Procedure:   Right Total Knee Arthroplasty  Date of Surgery:  Clearance 09/30/23                                Surgeon:  Dr. Mathews Solomons Surgeon's Group or Practice Name:  [facility] Phone number:  [phone] Fax number:  [phone]   Type of Clearance Requested:   - Medical  - Pharmacy:  Hold Aspirin     Type of Anesthesia:  Not Indicated   Additional requests/questions:    Gardiner Jumper   09/23/2023, 9:15 AM

## 2023-09-23 NOTE — Telephone Encounter (Signed)
 Pt has been scheduled to see Mertha Abrahams, [facility] 09/28/23 for preop clearance. Ok per Charles Connor, NP preop APP today to advise pt that he will need to hold Eliquis beginning Sunday 09/27/23. Pt aware he will take up to and including Saturday 09/26/23. Pt is grateful for all of our help.   I will update all parties involved.

## 2023-09-23 NOTE — Telephone Encounter (Signed)
 I will send a message to the EP schedulers to see if they can get the pt in for preop clearance.

## 2023-09-24 NOTE — Progress Notes (Unsigned)
 Cardiology Office Note:  .   Date:  09/24/2023  ID:  Joseph [name], DOB [birth year], MRN [id number] PCP: Lemar Pyles, NP  [facility] Providers Cardiologist:  None Electrophysiologist:  Boyce Byes, MD {  History of Present Illness: .   Joseph [name] is a 64 y.o. male w/PMHx of  HLD DCM > presumed 2/2 RVR AFib  Last saw Dr. Marven Slimmer 6/5/242, had previously been planned for ablation for his AFib though cancelled 2/2 a death in the family and this got waylaid. At this visit planned to re-pursue ablation strategy. Also discussed Elevated ferritin, iron saturation, transaminases Concerning for hemochromatosis, questioning if his systolic heart failure may be related may need MRI  TTE 02/24/23 noted recovered LVEF 55-60%  AFib ablation 03/20/23 Pre-procedure CT note dilated root/ascending AO 4.9cm  Missed his post procedure appointments 7 post procedure visits have been canceled post procedure, unknown why/by who   Today's visit is scheduled as pre-op Procedure: right total knee arthroplasty  Date of procedure: 09/30/23  RCRI score is one, (0.9%)  ROS:   *** needs CT for his AO recs for semi-annual, and referral to CTS *** why did his visits get canceled? *** amio labs *** eliquis , dose, labs, bleeding *** stop amio?   Arrhythmia/AAD hx AFib found Jan 2022 Amiodarone  started Feb 2022 Afib ablation 03/20/23  Studies Reviewed: Aaron Aas    EKG done today and reviewed by myself:  ***  03/20/23: EPS/ablation CONCLUSIONS: 1. Successful PVI 2. Successful ablation/isolation of the posterior wall 3. Intracardiac echo reveals trivial pericardial effusion, normal LA architecture 4. No early apparent complications. 5. Colchicine  0.6mg  PO BID x 5 days 6. Protonix  40mg  PO daily x 45 days  02/27/23: Cardiac CT IMPRESSION: 1. There is normal pulmonary vein drainage into the left atrium. (2 on the right and 2 on the left)   2. The left atrial  appendage is a chicken wing type with ostial size 30 x 20 mm and length 41 mm, Area 46 mm2. There is no thrombus in the left atrial appendage.   3. The esophagus runs in the left atrial midline and is not in the proximity to any of the pulmonary veins.   4. Coronary calcium  score of 42.8. This is 50th percentile for age and gender matched controls.  IMPRESSION: 1. 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 Recommend semi-annual imaging followup by CTA or MRA and referral to cardiothoracic surgery if not already obtained.   02/24/23: TTE 1. Left ventricular ejection fraction, by estimation, is 55 to 60%. The  left ventricle has normal function. The left ventricle has no regional  wall motion abnormalities. Left ventricular diastolic parameters are  consistent with Grade I diastolic  dysfunction (impaired relaxation).   2. Right ventricular systolic function is normal. The right ventricular  size is normal. Tricuspid regurgitation signal is inadequate for assessing  PA pressure.   3. Left atrial size was moderately dilated.   4. The mitral valve is normal in structure. No evidence of mitral valve  regurgitation. No evidence of mitral stenosis.   5. The aortic valve has an indeterminant number of cusps. Aortic valve  regurgitation is not visualized. No aortic stenosis is present.   6. There is moderate dilatation of the aortic root, measuring 48 mm.  There is moderate dilatation of the ascending aorta, measuring 47 mm.   7. The inferior vena cava is normal in size with greater  than 50%  respiratory variability, suggesting right atrial pressure of 3 mmHg.    09/21/20: TTE  1. Left ventricular ejection fraction, by estimation, is 30 to 35%. The  left ventricle has moderate to severely decreased function. The left  ventricle demonstrates global hypokinesis. There is mild left ventricular  hypertrophy. Left ventricular   diastolic parameters are indeterminate.   2. Right ventricular systolic function is normal. The right ventricular  size is normal.   3. Left atrial size was mildly dilated.   4. The mitral valve is grossly normal. No evidence of mitral valve  regurgitation.   5. The aortic valve was not well visualized. Aortic valve regurgitation  is not visualized. Mild aortic valve sclerosis is present, with no  evidence of aortic valve stenosis.   6. Aortic dilatation noted. There is moderate dilatation of the ascending  aorta, measuring 48 mm. There is mild dilatation of the aortic root,  measuring 39 mm.   7. The inferior vena cava is normal in size with greater than 50%  respiratory variability, suggesting right atrial pressure of 3 mmHg.   Comparison(s): ASD was not demonstrated on this exam.   Risk Assessment/Calculations:    Physical Exam:   VS:  There were no vitals taken for this visit.   Wt Readings from Last 3 Encounters:  03/20/23 275 lb (124.7 kg)  03/17/23 276 lb (125.2 kg)  11/12/22 270 lb (122.5 kg)    GEN: Well nourished, well developed in no acute distress NECK: No JVD; No carotid bruits CARDIAC: ***RRR, no murmurs, rubs, gallops RESPIRATORY:  *** CTA b/l without rales, wheezing or rhonchi  ABDOMEN: Soft, non-tender, non-distended EXTREMITIES:  No edema; No deformity   ASSESSMENT AND PLAN: .    persistent AFib CHA2DS2Vasc is one, on Eliquis , *** appropriately dosed *** burden by symptoms Chronic amiodarone   Chronic CHF DCM Recovered LVEF by his last echo Sept 2024 ***  Secondary hypercoagulable state 2/2 AFib  5.   Pre-op evaluation Low cardiac risk surgery Low cardiac risk score of one, (0.9%) ***     {Are you ordering a CV Procedure (e.g. stress test, cath, DCCV, TEE, etc)?   Press F2        :[id number]}     Dispo: ***  Signed, Debbie Fails, PA-C

## 2023-09-24 NOTE — Telephone Encounter (Signed)
 This request was a duplicate. Original was entered in 09/22/23. Pt has been advised already when to begin to hold Eliquis as he has been added to see Mertha Abrahams, [facility] 09/28/23.   I will call the surgeon's office as FYI to inform them the pt is on Eliquis not ASA. I called the # on request and I was given a # [phone] [facility] surgery scheduler. I left a very detailed message for [facility] in regard to pt on Eliquis and not ASA; as well as pt has appt 09/28/23.

## 2023-09-25 ENCOUNTER — Ambulatory Visit: Admitting: Nurse Practitioner

## 2023-09-25 DIAGNOSIS — I77819 Aortic ectasia, unspecified site: Secondary | ICD-10-CM

## 2023-09-25 DIAGNOSIS — D696 Thrombocytopenia, unspecified: Secondary | ICD-10-CM

## 2023-09-25 DIAGNOSIS — D6869 Other thrombophilia: Secondary | ICD-10-CM

## 2023-09-25 DIAGNOSIS — I5022 Chronic systolic (congestive) heart failure: Secondary | ICD-10-CM

## 2023-09-25 DIAGNOSIS — R7989 Other specified abnormal findings of blood chemistry: Secondary | ICD-10-CM

## 2023-09-25 DIAGNOSIS — R030 Elevated blood-pressure reading, without diagnosis of hypertension: Secondary | ICD-10-CM

## 2023-09-25 DIAGNOSIS — I4819 Other persistent atrial fibrillation: Secondary | ICD-10-CM

## 2023-09-25 DIAGNOSIS — E785 Hyperlipidemia, unspecified: Secondary | ICD-10-CM

## 2023-09-25 DIAGNOSIS — F1020 Alcohol dependence, uncomplicated: Secondary | ICD-10-CM

## 2023-09-28 ENCOUNTER — Ambulatory Visit: Admitting: Physician Assistant

## 2023-09-30 DIAGNOSIS — M1711 Unilateral primary osteoarthritis, right knee: Secondary | ICD-10-CM | POA: Diagnosis not present

## 2023-09-30 DIAGNOSIS — M25561 Pain in right knee: Secondary | ICD-10-CM | POA: Diagnosis not present

## 2023-10-13 DIAGNOSIS — M1711 Unilateral primary osteoarthritis, right knee: Secondary | ICD-10-CM | POA: Diagnosis not present

## 2023-10-16 ENCOUNTER — Ambulatory Visit: Admitting: Cardiology

## 2023-10-16 NOTE — Progress Notes (Deleted)
     Electrophysiology Clinic Note    Date:  10/16/2023  Patient ID:  Bader, Lello 1960/10/16, MRN [id number] PCP:  Lemar Pyles, NP  Cardiologist:  None Electrophysiologist: Boyce Byes, MD  ***refresh  Discussed the use of AI scribe software for clinical note transcription with the patient, who gave verbal consent to proceed.   Patient Profile    Chief Complaint: ***  History of Present Illness: Mavric Slavey is a 63 y.o. male with PMH notable for ***; seen today for Boyce Byes, MD for {VISITTYPE:28148}     Arrhythmia/Device History No specialty comments available.    Device Information: ***  AAD History: ***    ROS:  Please see the history of present illness. All other systems are reviewed and otherwise negative.    Physical Exam    VS:  There were no vitals taken for this visit. BMI: There is no height or weight on file to calculate BMI.  Wt Readings from Last 3 Encounters:  03/20/23 275 lb (124.7 kg)  03/17/23 276 lb (125.2 kg)  11/12/22 270 lb (122.5 kg)     GEN- The patient is well appearing, alert and oriented x 3 today.   Lungs- Clear to ausculation bilaterally, normal work of breathing.  Heart- {Blank single:19197::"Regular","Irregularly irregular"} rate and rhythm, no murmurs, rubs or gallops Extremities- {EDEMA LEVEL:28147::"No"} peripheral edema, warm, dry Skin-  *** device pocket well-healed, no tethering   Device interrogation done today and reviewed by myself:  Battery *** Lead thresholds, impedence, sensing stable *** *** episodes *** changes made today   Studies Reviewed   Previous EP, cardiology notes.    EKG {ACTION; IS/IS DUK:02542706} ordered. Personal review of EKG from {Blank single:19197::"today","***"} shows:  ***             Assessment and Plan     #) ***   #) ***   {Are you ordering a CV Procedure (e.g. stress test, cath, DCCV, TEE, etc)?   Press F2         :[id number]}   Current medicines are reviewed at length with the patient today.   The patient {ACTIONS; HAS/DOES NOT HAVE:19233} concerns regarding his medicines.  The following changes were made today:  {NONE DEFAULTED:18576}  Labs/ tests ordered today include: *** No orders of the defined types were placed in this encounter.    Disposition: Follow up with {EPMDS:28135} or EP APP {EPFOLLOW UP:28173}   Signed, Adaline Holly, NP  10/16/23  11:24 AM  Electrophysiology [facility]

## 2023-10-17 NOTE — Patient Instructions (Incomplete)
 Be Involved in Caring For Your Health:  Taking Medications When medications are taken as directed, they can greatly improve your health. But if they are not taken as prescribed, they may not work. In some cases, not taking them correctly can be harmful. To help ensure your treatment remains effective and safe, understand your medications and how to take them. Bring your medications to each visit for review by your provider.  Your lab results, notes, and after visit summary will be available on My Chart. We strongly encourage you to use this feature. If lab results are abnormal the clinic will contact you with the appropriate steps. If the clinic does not contact you assume the results are satisfactory. You can always view your results on My Chart. If you have questions regarding your health or results, please contact the clinic during office hours. You can also ask questions on My Chart.  We at [facility] are grateful that you chose [place] to provide your care. We strive to provide evidence-based and compassionate care and are always looking for feedback. If you get a survey from the clinic please complete this so we can hear your opinions.  Heart-Healthy Eating Plan Many factors influence your heart health, including eating and exercise habits. Heart health is also called coronary health. Coronary risk increases with abnormal blood fat (lipid) levels. A heart-healthy eating plan includes limiting unhealthy fats, increasing healthy fats, limiting salt (sodium) intake, and making other diet and lifestyle changes. What is my plan? Your health care provider may recommend that: You limit your fat intake to _________% or less of your total calories each day. You limit your saturated fat intake to _________% or less of your total calories each day. You limit the amount of cholesterol in your diet to less than _________ mg per day. You limit the amount of sodium in your diet to less than _________  mg per day. What are tips for following this plan? Cooking Cook foods using methods other than frying. Baking, boiling, grilling, and broiling are all good options. Other ways to reduce fat include: Removing the skin from poultry. Removing all visible fats from meats. Steaming vegetables in water or broth. Meal planning  At meals, imagine dividing your plate into fourths: Fill one-half of your plate with vegetables and green salads. Fill one-fourth of your plate with whole grains. Fill one-fourth of your plate with lean protein foods. Eat 2-4 cups of vegetables per day. One cup of vegetables equals 1 cup (91 g) broccoli or cauliflower florets, 2 medium carrots, 1 large bell pepper, 1 large sweet potato, 1 large tomato, 1 medium white potato, 2 cups (150 g) raw leafy greens. Eat 1-2 cups of fruit per day. One cup of fruit equals 1 small apple, 1 large banana, 1 cup (237 g) mixed fruit, 1 large orange,  cup (82 g) dried fruit, 1 cup (240 mL) 100% fruit juice. Eat more foods that contain soluble fiber. Examples include apples, broccoli, carrots, beans, peas, and barley. Aim to get 25-30 g of fiber per day. Increase your consumption of legumes, nuts, and seeds to 4-5 servings per week. One serving of dried beans or legumes equals  cup (90 g) cooked, 1 serving of nuts is  oz (12 almonds, 24 pistachios, or 7 walnut halves), and 1 serving of seeds equals  oz (8 g). Fats Choose healthy fats more often. Choose monounsaturated and polyunsaturated fats, such as olive and canola oils, avocado oil, flaxseeds, walnuts, almonds, and seeds. Eat  more omega-3 fats. Choose salmon, mackerel, sardines, tuna, flaxseed oil, and ground flaxseeds. Aim to eat fish at least 2 times each week. Check food labels carefully to identify foods with trans fats or high amounts of saturated fat. Limit saturated fats. These are found in animal products, such as meats, butter, and cream. Plant sources of saturated fats  include palm oil, palm kernel oil, and coconut oil. Avoid foods with partially hydrogenated oils in them. These contain trans fats. Examples are stick margarine, some tub margarines, cookies, crackers, and other baked goods. Avoid fried foods. General information Eat more home-cooked food and less restaurant, buffet, and fast food. Limit or avoid alcohol. Limit foods that are high in added sugar and simple starches such as foods made using white refined flour (white breads, pastries, sweets). Lose weight if you are overweight. Losing just 5-10% of your body weight can help your overall health and prevent diseases such as diabetes and heart disease. Monitor your sodium intake, especially if you have high blood pressure. Talk with your health care provider about your sodium intake. Try to incorporate more vegetarian meals weekly. What foods should I eat? Fruits All fresh, canned (in natural juice), or frozen fruits. Vegetables Fresh or frozen vegetables (raw, steamed, roasted, or grilled). Green salads. Grains Most grains. Choose whole wheat and whole grains most of the time. Rice and pasta, including brown rice and pastas made with whole wheat. Meats and other proteins Lean, well-trimmed beef, veal, pork, and lamb. Chicken and [place] without skin. All fish and shellfish. Wild duck, rabbit, pheasant, and venison. Egg whites or low-cholesterol egg substitutes. Dried beans, peas, lentils, and tofu. Seeds and most nuts. Dairy Low-fat or nonfat cheeses, including ricotta and mozzarella. Skim or 1% milk (liquid, powdered, or evaporated). Buttermilk made with low-fat milk. Nonfat or low-fat yogurt. Fats and oils Non-hydrogenated (trans-free) margarines. Vegetable oils, including soybean, sesame, sunflower, olive, avocado, peanut, safflower, corn, canola, and cottonseed. Salad dressings or mayonnaise made with a vegetable oil. Beverages Water (mineral or sparkling). Coffee and tea. Unsweetened ice  tea. Diet beverages. Sweets and desserts Sherbet, gelatin, and fruit ice. Small amounts of dark chocolate. Limit all sweets and desserts. Seasonings and condiments All seasonings and condiments. The items listed above may not be a complete list of foods and beverages you can eat. Contact a dietitian for more options. What foods should I avoid? Fruits Canned fruit in heavy syrup. Fruit in cream or butter sauce. Fried fruit. Limit coconut. Vegetables Vegetables cooked in cheese, cream, or butter sauce. Fried vegetables. Grains Breads made with saturated or trans fats, oils, or whole milk. Croissants. Sweet rolls. Donuts. High-fat crackers, such as cheese crackers and chips. Meats and other proteins Fatty meats, such as hot dogs, ribs, sausage, bacon, rib-eye roast or steak. High-fat deli meats, such as salami and bologna. Caviar. Domestic duck and goose. Organ meats, such as liver. Dairy Cream, sour cream, cream cheese, and creamed cottage cheese. Whole-milk cheeses. Whole or 2% milk (liquid, evaporated, or condensed). Whole buttermilk. Cream sauce or high-fat cheese sauce. Whole-milk yogurt. Fats and oils Meat fat, or shortening. Cocoa butter, hydrogenated oils, palm oil, coconut oil, palm kernel oil. Solid fats and shortenings, including bacon fat, salt pork, lard, and butter. Nondairy cream substitutes. Salad dressings with cheese or sour cream. Beverages Regular sodas and any drinks with added sugar. Sweets and desserts Frosting. Pudding. Cookies. Cakes. Pies. Milk chocolate or white chocolate. Buttered syrups. Full-fat ice cream or ice cream drinks. The items listed above may  not be a complete list of foods and beverages to avoid. Contact a dietitian for more information. Summary Heart-healthy meal planning includes limiting unhealthy fats, increasing healthy fats, limiting salt (sodium) intake and making other diet and lifestyle changes. Lose weight if you are overweight. Losing just  5-10% of your body weight can help your overall health and prevent diseases such as diabetes and heart disease. Focus on eating a balance of foods, including fruits and vegetables, low-fat or nonfat dairy, lean protein, nuts and legumes, whole grains, and heart-healthy oils and fats. This information is not intended to replace advice given to you by your health care provider. Make sure you discuss any questions you have with your health care provider. Document Revised: 07/01/2021 Document Reviewed: 07/01/2021 Elsevier Patient Education  2024 [employer].

## 2023-10-19 ENCOUNTER — Ambulatory Visit: Admitting: Nurse Practitioner

## 2023-10-19 DIAGNOSIS — E785 Hyperlipidemia, unspecified: Secondary | ICD-10-CM

## 2023-10-19 DIAGNOSIS — R030 Elevated blood-pressure reading, without diagnosis of hypertension: Secondary | ICD-10-CM

## 2023-10-19 DIAGNOSIS — G4733 Obstructive sleep apnea (adult) (pediatric): Secondary | ICD-10-CM

## 2023-10-19 DIAGNOSIS — D696 Thrombocytopenia, unspecified: Secondary | ICD-10-CM

## 2023-10-19 DIAGNOSIS — I5022 Chronic systolic (congestive) heart failure: Secondary | ICD-10-CM

## 2023-10-19 DIAGNOSIS — R7989 Other specified abnormal findings of blood chemistry: Secondary | ICD-10-CM

## 2023-10-19 DIAGNOSIS — I77819 Aortic ectasia, unspecified site: Secondary | ICD-10-CM

## 2023-10-19 DIAGNOSIS — D6869 Other thrombophilia: Secondary | ICD-10-CM

## 2023-10-19 DIAGNOSIS — F1020 Alcohol dependence, uncomplicated: Secondary | ICD-10-CM

## 2023-10-19 DIAGNOSIS — Z8679 Personal history of other diseases of the circulatory system: Secondary | ICD-10-CM

## 2023-10-21 NOTE — Progress Notes (Deleted)
     Electrophysiology Clinic Note    Date:  10/21/2023  Patient ID:  Joseph [name], Joseph [name] May 09, 1961, MRN [id number] PCP:  Lemar Pyles, NP  Cardiologist:  None Electrophysiologist: Boyce Byes, MD  ***refresh  Discussed the use of AI scribe software for clinical note transcription with the patient, who gave verbal consent to proceed.   Patient Profile    Chief Complaint: ***  History of Present Illness: Joseph [name] is a 63 y.o. male with PMH notable for ***; seen today for Boyce Byes, MD for {VISITTYPE:28148}     Arrhythmia/Device History No specialty comments available.    Device Information: ***  AAD History: ***    ROS:  Please see the history of present illness. All other systems are reviewed and otherwise negative.    Physical Exam    VS:  There were no vitals taken for this visit. BMI: There is no height or weight on file to calculate BMI.  Wt Readings from Last 3 Encounters:  03/20/23 275 lb (124.7 kg)  03/17/23 276 lb (125.2 kg)  11/12/22 270 lb (122.5 kg)     GEN- The patient is well appearing, alert and oriented x 3 today.   Lungs- Clear to ausculation bilaterally, normal work of breathing.  Heart- {Blank single:19197::"Regular","Irregularly irregular"} rate and rhythm, no murmurs, rubs or gallops Extremities- {EDEMA LEVEL:28147::"No"} peripheral edema, warm, dry Skin-  *** device pocket well-healed, no tethering   Device interrogation done today and reviewed by myself:  Battery *** Lead thresholds, impedence, sensing stable *** *** episodes *** changes made today   Studies Reviewed   Previous EP, cardiology notes.    EKG {ACTION; IS/IS WUJ:[id number]} ordered. Personal review of EKG from {Blank single:19197::"today","***"} shows:  ***             Assessment and Plan     #) ***   #) ***   {Are you ordering a CV Procedure (e.g. stress test, cath, DCCV, TEE, etc)?   Press F2         :[id number]}   Current medicines are reviewed at length with the patient today.   The patient {ACTIONS; HAS/DOES NOT HAVE:19233} concerns regarding his medicines.  The following changes were made today:  {NONE DEFAULTED:18576}  Labs/ tests ordered today include: *** No orders of the defined types were placed in this encounter.    Disposition: Follow up with {EPMDS:28135} or EP APP {EPFOLLOW UP:28173}   Signed, Niv Darley, NP  10/21/23  4:21 PM  Electrophysiology [facility]

## 2023-10-22 ENCOUNTER — Ambulatory Visit: Admitting: Cardiology

## 2023-11-11 ENCOUNTER — Other Ambulatory Visit: Payer: Self-pay | Admitting: Cardiology

## 2023-11-30 ENCOUNTER — Other Ambulatory Visit: Payer: Self-pay | Admitting: Cardiology

## 2023-12-01 NOTE — Telephone Encounter (Signed)
 Prescription refill request for Eliquis  received. Indication:AFIB Last office visit:4/25 Scr:0.90  10/24 Age: 63 Weight:124.7  kg  Prescription refilled

## 2023-12-06 ENCOUNTER — Other Ambulatory Visit: Payer: Self-pay | Admitting: Cardiology

## 2023-12-07 NOTE — Telephone Encounter (Signed)
 Please contact pt for future appointment with Dr. Cindie. Pt due for follow up. Pt needing refills.

## 2023-12-08 NOTE — Telephone Encounter (Signed)
 Left voicemail to schedule appt for med refill

## 2023-12-15 NOTE — Telephone Encounter (Signed)
 Left voice mail to schedule appt

## 2023-12-21 NOTE — Telephone Encounter (Signed)
Pt scheduled on 7/23

## 2023-12-22 ENCOUNTER — Other Ambulatory Visit: Payer: Self-pay | Admitting: Cardiology

## 2023-12-29 ENCOUNTER — Encounter: Payer: Self-pay | Admitting: Cardiology

## 2023-12-29 DIAGNOSIS — I5032 Chronic diastolic (congestive) heart failure: Secondary | ICD-10-CM | POA: Insufficient documentation

## 2023-12-29 DIAGNOSIS — I4891 Unspecified atrial fibrillation: Secondary | ICD-10-CM | POA: Insufficient documentation

## 2023-12-29 DIAGNOSIS — I5020 Unspecified systolic (congestive) heart failure: Secondary | ICD-10-CM | POA: Insufficient documentation

## 2023-12-29 NOTE — Progress Notes (Deleted)
 Electrophysiology Clinic Note    Date:  12/29/2023  Patient ID:  Joseph [name], Joseph [name] July 28, 1960, MRN [id number] PCP:  Valerio Melanie DASEN, NP  Cardiologist:  None   Electrophysiologist:  [name], MD    ***refresh  Discussed the use of AI scribe software for clinical note transcription with the patient, who gave verbal consent to proceed.   Patient Profile    Chief Complaint: AFib follow-up  History of Present Illness: Man Joseph [name] is a 63 y.o. male with PMH notable for persis AFib, HFimpEF, ascending aortic aneurysm, OSA, ; seen today for [name], MD for routine electrophysiology followup.   He is s/p AF ablation w isolation of pulm veins, posterior wall on 03/2023 by Dr. HOLTS. He has not been seen since that time.   On follow-up today, *** AF burden, symptoms *** palpitations *** bleeding concerns   - needs all labs - needs aortic aneurysm imaging   Since last being seen in our clinic the patient reports doing ***.  he denies chest pain, palpitations, dyspnea, PND, orthopnea, nausea, vomiting, dizziness, syncope, edema, weight gain, or early satiety.      Arrhythmia/Device History Amiodarone     ROS:  Please see the history of present illness. All other systems are reviewed and otherwise negative.    Physical Exam    VS:  There were no vitals taken for this visit. BMI: There is no height or weight on file to calculate BMI.      Wt Readings from Last 3 Encounters:  03/20/23 275 lb (124.7 kg)  03/17/23 276 lb (125.2 kg)  11/12/22 270 lb (122.5 kg)     GEN- The patient is well appearing, alert and oriented x 3 today.   Lungs- Clear to ausculation bilaterally, normal work of breathing.  Heart- {Blank single:19197::Regular,Irregularly irregular} rate and rhythm, no murmurs, rubs or gallops Extremities- {EDEMA LEVEL:28147::No} peripheral edema, warm, dry    Studies Reviewed   Previous EP, cardiology notes.     EKG is ordered. Personal review of EKG from today shows:  ***        Chest CT overread, 02/27/2023 1. 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 Recommend semi-annual imaging followup by CTA or MRA and referral to cardiothoracic surgery if not already obtained. This recommendation follows 2010 ACCF/AHA/AATS/ACR/ASA/SCA/SCAI/SIR/STS/SVM Guidelines for the Diagnosis and Management of Patients With Thoracic Aortic Disease. Circulation. 2010; 121: Z733-z630. Aortic aneurysm NOS (ICD10-I71.9)  Cardiac CT, 02/27/2023 1. There is normal pulmonary vein drainage into the left atrium. (2 on the right and 2 on the left)  2. The left atrial appendage is a chicken wing type with ostial size 30 x 20 mm and length 41 mm, Area 46 mm2. There is no thrombus in the left atrial appendage.  3. The esophagus runs in the left atrial midline and is not in the proximity to any of the pulmonary veins.  4. Coronary calcium  score of 42.8. This is 50th percentile for age and gender matched controls.  TTE, 02/24/2023  1. Left ventricular ejection fraction, by estimation, is 55 to 60%. The left ventricle has normal function. The left ventricle has no regional wall motion abnormalities. Left ventricular diastolic parameters are consistent with Grade I diastolic dysfunction (impaired relaxation).   2. Right ventricular systolic function is normal. The right ventricular size is normal. Tricuspid regurgitation signal is inadequate for assessing PA pressure.   3. Left atrial size  was moderately dilated.   4. The mitral valve is normal in structure. No evidence of mitral valve regurgitation. No evidence of mitral stenosis.   5. The aortic valve has an indeterminant number of cusps. Aortic valve  regurgitation is not visualized. No aortic stenosis is present.   6. There is moderate dilatation of the aortic root, measuring 48 mm.  There is moderate dilatation of the  ascending aorta, measuring 47 mm.   7. The inferior vena cava is normal in size with greater than 50% respiratory variability, suggesting right atrial pressure of 3 mmHg.   TTE, 09/21/2020 1. Left ventricular ejection fraction, by estimation, is 30 to 35%. The left ventricle has moderate to severely decreased function. The left ventricle demonstrates global hypokinesis. There is mild left ventricular hypertrophy. Left ventricular diastolic parameters are indeterminate.   2. Right ventricular systolic function is normal. The right ventricular size is normal.   3. Left atrial size was mildly dilated.   4. The mitral valve is grossly normal. No evidence of mitral valve regurgitation.   5. The aortic valve was not well visualized. Aortic valve regurgitation is not visualized. Mild aortic valve sclerosis is present, with no evidence of aortic valve stenosis.   6. Aortic dilatation noted. There is moderate dilatation of the ascending aorta, measuring 48 mm. There is mild dilatation of the aortic root, measuring 39 mm.   7. The inferior vena cava is normal in size with greater than 50% respiratory variability, suggesting right atrial pressure of 3 mmHg.   Comparison(s): ASD was not demonstrated on this exam.    Assessment and Plan     #) persis AFib S/p AF ablation 03/2023   #) Hypercoag d/t *** afib CHA2DS2-VASc Score = at least 1 [CHF History: 1, HTN History: 0, Diabetes History: 0, Stroke History: 0, Vascular Disease History: 0, Age Score: 0, Gender Score: 0].  Therefore, the patient's annual risk of stroke is 0.6 %.     {Confirm score is correct.  If not, click here to update score.  REFRESH note.  :1}   Stroke ppx - ***, appropriately dosed No bleeding concerns  #) HFimpEF   #) ***   {Are you ordering a CV Procedure (e.g. stress test, cath, DCCV, TEE, etc)?   Press F2        :[id number]}   Current medicines are reviewed at length with the patient today.   The patient {ACTIONS;  HAS/DOES NOT HAVE:19233} concerns regarding his medicines.  The following changes were made today:  {NONE DEFAULTED:18576}  Labs/ tests ordered today include: *** No orders of the defined types were placed in this encounter.    Disposition: Follow up with {EPMDS:28135::EP Team} or EP APP {EPFOLLOW UP:28173}   Signed, Chantal Needle, NP  12/29/23  8:34 AM  Electrophysiology [facility]

## 2023-12-30 ENCOUNTER — Ambulatory Visit: Admitting: Cardiology

## 2023-12-30 DIAGNOSIS — I5032 Chronic diastolic (congestive) heart failure: Secondary | ICD-10-CM

## 2023-12-30 DIAGNOSIS — I77819 Aortic ectasia, unspecified site: Secondary | ICD-10-CM

## 2023-12-30 DIAGNOSIS — I4819 Other persistent atrial fibrillation: Secondary | ICD-10-CM

## 2024-01-14 ENCOUNTER — Encounter: Payer: Self-pay | Admitting: Cardiology

## 2024-01-14 ENCOUNTER — Telehealth: Payer: Self-pay | Admitting: Cardiology

## 2024-01-14 NOTE — Telephone Encounter (Signed)
 I find it necessary to inform you that Suzann riddle, NP will no longer be able to provide medical care to you. I believe that our patient/provider relationship has been compromised. I would urge to to find another provider to attend to your medical care needs immediately.  01/14/24

## 2024-01-21 ENCOUNTER — Other Ambulatory Visit: Payer: Self-pay | Admitting: Cardiology

## 2024-01-27 NOTE — Progress Notes (Deleted)
 Electrophysiology Clinic Note    Date:  01/27/2024  Patient ID:  Joseph, [name] Feb 04, 1961, MRN [id number] PCP:  Valerio Melanie DASEN, NP  Cardiologist:  None   Electrophysiologist:  [name], MD    ***refresh  Discussed the use of AI scribe software for clinical note transcription with the patient, who gave verbal consent to proceed.   Patient Profile    Chief Complaint: AFib follow-up  History of Present Illness: Joseph [name] is a 63 y.o. male with PMH notable for persis AFib, HFimpEF, ascending aortic aneurysm, OSA, ; seen today for [name], MD for routine electrophysiology followup.   He is s/p AF ablation w isolation of pulm veins, posterior wall on 03/2023 by Dr. HOLTS. He has not been seen since that time.   On follow-up today, *** AF burden, symptoms *** palpitations *** bleeding concerns   - needs all labs - needs aortic aneurysm imaging   Since last being seen in our clinic the patient reports doing ***.  he denies chest pain, palpitations, dyspnea, PND, orthopnea, nausea, vomiting, dizziness, syncope, edema, weight gain, or early satiety.      Arrhythmia/Device History Amiodarone     ROS:  Please see the history of present illness. All other systems are reviewed and otherwise negative.    Physical Exam    VS:  There were no vitals taken for this visit. BMI: There is no height or weight on file to calculate BMI.      Wt Readings from Last 3 Encounters:  03/20/23 275 lb (124.7 kg)  03/17/23 276 lb (125.2 kg)  11/12/22 270 lb (122.5 kg)     GEN- The patient is well appearing, alert and oriented x 3 today.   Lungs- Clear to ausculation bilaterally, normal work of breathing.  Heart- {Blank single:19197::Regular,Irregularly irregular} rate and rhythm, no murmurs, rubs or gallops Extremities- {EDEMA LEVEL:28147::No} peripheral edema, warm, dry    Studies Reviewed   Previous EP, cardiology notes.     EKG is ordered. Personal review of EKG from today shows:  ***        Chest CT overread, 02/27/2023 1. 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 Recommend semi-annual imaging followup by CTA or MRA and referral to cardiothoracic surgery if not already obtained. This recommendation follows 2010 ACCF/AHA/AATS/ACR/ASA/SCA/SCAI/SIR/STS/SVM Guidelines for the Diagnosis and Management of Patients With Thoracic Aortic Disease. Circulation. 2010; 121: Z733-z630. Aortic aneurysm NOS (ICD10-I71.9)  Cardiac CT, 02/27/2023 1. There is normal pulmonary vein drainage into the left atrium. (2 on the right and 2 on the left)  2. The left atrial appendage is a chicken wing type with ostial size 30 x 20 mm and length 41 mm, Area 46 mm2. There is no thrombus in the left atrial appendage.  3. The esophagus runs in the left atrial midline and is not in the proximity to any of the pulmonary veins.  4. Coronary calcium  score of 42.8. This is 50th percentile for age and gender matched controls.  TTE, 02/24/2023  1. Left ventricular ejection fraction, by estimation, is 55 to 60%. The left ventricle has normal function. The left ventricle has no regional wall motion abnormalities. Left ventricular diastolic parameters are consistent with Grade I diastolic dysfunction (impaired relaxation).   2. Right ventricular systolic function is normal. The right ventricular size is normal. Tricuspid regurgitation signal is inadequate for assessing PA pressure.   3. Left atrial size  was moderately dilated.   4. The mitral valve is normal in structure. No evidence of mitral valve regurgitation. No evidence of mitral stenosis.   5. The aortic valve has an indeterminant number of cusps. Aortic valve  regurgitation is not visualized. No aortic stenosis is present.   6. There is moderate dilatation of the aortic root, measuring 48 mm.  There is moderate dilatation of the  ascending aorta, measuring 47 mm.   7. The inferior vena cava is normal in size with greater than 50% respiratory variability, suggesting right atrial pressure of 3 mmHg.   TTE, 09/21/2020 1. Left ventricular ejection fraction, by estimation, is 30 to 35%. The left ventricle has moderate to severely decreased function. The left ventricle demonstrates global hypokinesis. There is mild left ventricular hypertrophy. Left ventricular diastolic parameters are indeterminate.   2. Right ventricular systolic function is normal. The right ventricular size is normal.   3. Left atrial size was mildly dilated.   4. The mitral valve is grossly normal. No evidence of mitral valve regurgitation.   5. The aortic valve was not well visualized. Aortic valve regurgitation is not visualized. Mild aortic valve sclerosis is present, with no evidence of aortic valve stenosis.   6. Aortic dilatation noted. There is moderate dilatation of the ascending aorta, measuring 48 mm. There is mild dilatation of the aortic root, measuring 39 mm.   7. The inferior vena cava is normal in size with greater than 50% respiratory variability, suggesting right atrial pressure of 3 mmHg.   Comparison(s): ASD was not demonstrated on this exam.    Assessment and Plan     #) persis AFib S/p AF ablation 03/2023   #) Hypercoag d/t *** afib CHA2DS2-VASc Score = at least 1 [CHF History: 1, HTN History: 0, Diabetes History: 0, Stroke History: 0, Vascular Disease History: 0, Age Score: 0, Gender Score: 0].  Therefore, the patient's annual risk of stroke is 0.6 %.     {Confirm score is correct.  If not, click here to update score.  REFRESH note.  :1}   Stroke ppx - ***, appropriately dosed No bleeding concerns  #) HFimpEF   #) ***   {Are you ordering a CV Procedure (e.g. stress test, cath, DCCV, TEE, etc)?   Press F2        :[id number]}   Current medicines are reviewed at length with the patient today.   The patient {ACTIONS;  HAS/DOES NOT HAVE:19233} concerns regarding his medicines.  The following changes were made today:  {NONE DEFAULTED:18576}  Labs/ tests ordered today include: *** No orders of the defined types were placed in this encounter.    Disposition: Follow up with {EPMDS:28135::EP Team} or EP APP {EPFOLLOW UP:28173}   Signed, Chantal Needle, NP  01/27/24  12:58 PM  Electrophysiology [facility]

## 2024-01-28 ENCOUNTER — Ambulatory Visit: Payer: Self-pay | Admitting: Cardiology

## 2024-02-08 ENCOUNTER — Other Ambulatory Visit: Payer: Self-pay | Admitting: Cardiology

## 2024-03-03 ENCOUNTER — Other Ambulatory Visit: Payer: Self-pay | Admitting: Cardiology

## 2024-03-20 ENCOUNTER — Other Ambulatory Visit: Payer: Self-pay | Admitting: Nurse Practitioner

## 2024-03-22 DIAGNOSIS — Z96651 Presence of right artificial knee joint: Secondary | ICD-10-CM | POA: Diagnosis not present

## 2024-03-22 NOTE — Telephone Encounter (Signed)
 Requested Prescriptions  Pending Prescriptions Disp Refills   rosuvastatin  (CRESTOR ) 10 MG tablet [Pharmacy Med Name: ROSUVASTATIN  CALCIUM  10 MG TAB] 90 tablet 0    Sig: TAKE 1 TABLET BY MOUTH EVERY DAY     Cardiovascular:  Antilipid - Statins 2 Failed - 03/22/2024  3:22 PM      Failed - Cr in normal range and within 360 days    Creatinine, Ser  Date Value Ref Range Status  03/17/2023 0.90 0.76 - 1.27 mg/dL Final         Failed - Valid encounter within last 12 months    Recent Outpatient Visits   None     Future Appointments             In 4 weeks Cindie Ole DASEN, MD [facility]            Failed - Lipid Panel in normal range within the last 12 months    Cholesterol, Total  Date Value Ref Range Status  03/17/2023 128 100 - 199 mg/dL Final   LDL Chol Calc (NIH)  Date Value Ref Range Status  03/17/2023 54 0 - 99 mg/dL Final   HDL  Date Value Ref Range Status  03/17/2023 60 >39 mg/dL Final   Triglycerides  Date Value Ref Range Status  03/17/2023 69 0 - 149 mg/dL Final         Passed - Patient is not pregnant

## 2024-04-03 NOTE — Patient Instructions (Incomplete)
 Heart Failure: Eating Plan Heart failure is a long-term condition where the heart can't pump enough blood through the body. When this happens, parts of the body don't get the blood and oxygen  they need. Living with heart failure can be hard. But a healthy lifestyle and choosing the right foods may help to improve your symptoms. If you have heart failure, your eating plan will include limiting the amount of salt, also called sodium, and unhealthy fats you eat. What are tips for following this plan? Reading food labels Check food labels for the amount of sodium per serving. Choose foods that have less than 140 mg (milligrams) of sodium in each serving. Check food labels for the number of calories per serving. This is important if you need to limit your daily calorie intake to lose weight. Check food labels for the serving size. If you eat more than one serving, you'll be eating more sodium and calories than what's listed on the label. Look for foods with the words sodium-free, very low sodium, or low sodium on the package. Foods labeled as reduced sodium, lightly salted, or no salt added may still have more sodium than what's recommended for you. Cooking Avoid adding salt when cooking. Before using any salt substitutes, talk with your health care provider or an expert in healthy eating called a dietitian. Season food with salt-free seasonings, spices, or herbs. Check the label of seasoning mixes to make sure they don't contain salt. Cook with heart-healthy oils, such as olive, canola, soybean, or sunflower oil. Do not fry foods. Cook foods using low-fat methods, like baking, boiling, grilling, and broiling. Limit unhealthy fats when cooking. To do this: Remove the skin from poultry, such as chicken. Remove all the fat you can see on meats. Skim the fat off from stews, soups, and gravies before serving them. Meal planning  Limit your intake of: Processed, canned, or prepackaged  foods. Foods that are high in trans fat, such as fried foods. Sweets, desserts, sugary drinks, and other foods with added sugar. Full-fat dairy products, such as whole milk. Eat a balanced diet. This may include: 4-5 servings of fruit each day and 4-5 servings of vegetables each day. At each meal, try to fill one-half of your plate with fruits and vegetables. Up to 6-8 servings of whole grains each day. Up to 2 servings of lean meat, poultry, or fish each day. One serving of meat is equal to 3 oz (85 g). This is about the same size as a deck of cards. 2 servings of low-fat dairy each day. Heart-healthy fats. Healthy fats called omega-3 fatty acids are a good choice. They're found in foods such as flaxseed and cold-water fish like sardines, salmon, and mackerel. Aim to eat 25-35 g (grams) of fiber a day. Foods that are high in fiber include apples, broccoli, carrots, beans, peas, and whole grains. Do not add salt or condiments that contain salt (such as soy sauce) to foods before eating. When eating at a restaurant, ask that your food be prepared with less salt or no salt, if possible. Try to eat 2 or more plant-based or meat-free meals each week. Cook more meals at home and eat less at restaurants, buffets, and fast food places. General information Do not eat more than 2,300 mg of sodium a day. The amount of sodium that's recommended for you may be lower, depending on your condition. Stay at a healthy weight as told. Ask your provider what a healthy weight is for you. Check  your weight every day. Work with your provider and dietitian to make a plan that will help you lose weight or stay at your current weight. Limit how much fluid you drink. Ask your provider or dietitian how much fluid you can have each day. Limit or avoid alcohol as told. Recommended foods Fruits All fresh, frozen, and canned fruits. Dried fruits, such as raisins, prunes, and cranberries. Vegetables All fresh vegetables.  Vegetables that are frozen without sauce or added salt. Low-sodium or sodium-free canned vegetables. Grains Bread with less than 80 mg of sodium per slice. Whole-wheat pasta, quinoa, and brown rice. Oats and oatmeal. Barley. Millet. Grits and cream of wheat. Whole-grain and whole-wheat cold cereal. Meats and other protein foods Lean cuts of meat. Skinless chicken and [place]. Fish with high omega-3 fatty acids, such as salmon, sardines, and other cold-water fishes. Eggs. Dried beans, peas, and edamame. Unsalted nuts and nut butters. Dairy Low-fat or nonfat (skim) milk and dried milk. Rice milk, soy milk, and almond milk. Low-fat or nonfat yogurt. Small amounts of reduced-sodium block cheese. Low-sodium cottage cheese. Fats and oils Olive, canola, soybean, flaxseed, avocado, or sunflower oil. Sweets and desserts Applesauce. Granola bars. Sugar-free pudding and gelatin. Frozen fruit bars. Seasoning and other foods Fresh and dried herbs. Lemon or lime juice. Vinegar. Low-sodium ketchup. Salt-free marinades, salad dressings, sauces, and seasonings. The items listed above may not be all the foods and drinks you can have. Talk with a dietitian to learn more. Foods to avoid Fruits Fruits that are dried with preservatives that contain sodium. Vegetables Canned vegetables. Frozen vegetables with sauce or seasonings. Creamed vegetables. [place] fries. Onion rings. Pickled vegetables and sauerkraut. Grains Bread with more than 80 mg of sodium per slice. Hot or cold cereal with more than 140 mg sodium per serving. Salted pretzels and crackers. Prepackaged breadcrumbs. Bagels, croissants, and biscuits. Meats and other protein foods Ribs and chicken wings. Bacon, ham, pepperoni, bologna, salami, and packaged luncheon meats. Hot dogs, bratwurst, and sausage. Canned meat. Smoked meat and fish. Salted nuts and seeds. Dairy Whole milk, half-and-half, and cream. Buttermilk. Processed cheese, cheese spreads, and  cheese curds. Regular cottage cheese. Feta cheese. Shredded cheese. String cheese. Fats and oils Butter, lard, shortening, ghee, and bacon fat. Canned and packaged gravies. Seasoning and other foods Onion salt, garlic salt, table salt, and sea salt. Marinades. Regular salad dressings. Relishes, pickles, and olives. Meat flavorings and tenderizers, and bouillon cubes. Horseradish, ketchup, and mustard. Worcestershire sauce. Teriyaki sauce, soy sauce (including reduced sodium). Hot sauce and Tabasco sauce. Steak sauce, fish sauce, oyster sauce, and cocktail sauce. Taco seasonings. Barbecue sauce. Tartar sauce. The items listed above may not be all the foods and drinks you should avoid. Talk with a dietitian to learn more. This information is not intended to replace advice given to you by your health care provider. Make sure you discuss any questions you have with your health care provider. Document Revised: 01/19/2023 Document Reviewed: 01/08/2023 Elsevier Patient Education  2024 [employer].

## 2024-04-07 ENCOUNTER — Ambulatory Visit: Payer: Self-pay | Admitting: Nurse Practitioner

## 2024-04-07 DIAGNOSIS — D696 Thrombocytopenia, unspecified: Secondary | ICD-10-CM

## 2024-04-07 DIAGNOSIS — I4819 Other persistent atrial fibrillation: Secondary | ICD-10-CM

## 2024-04-07 DIAGNOSIS — N400 Enlarged prostate without lower urinary tract symptoms: Secondary | ICD-10-CM

## 2024-04-07 DIAGNOSIS — R7989 Other specified abnormal findings of blood chemistry: Secondary | ICD-10-CM

## 2024-04-07 DIAGNOSIS — R030 Elevated blood-pressure reading, without diagnosis of hypertension: Secondary | ICD-10-CM

## 2024-04-07 DIAGNOSIS — F1020 Alcohol dependence, uncomplicated: Secondary | ICD-10-CM

## 2024-04-07 DIAGNOSIS — G4733 Obstructive sleep apnea (adult) (pediatric): Secondary | ICD-10-CM

## 2024-04-07 DIAGNOSIS — I5022 Chronic systolic (congestive) heart failure: Secondary | ICD-10-CM

## 2024-04-07 DIAGNOSIS — E785 Hyperlipidemia, unspecified: Secondary | ICD-10-CM

## 2024-04-20 ENCOUNTER — Other Ambulatory Visit: Payer: Self-pay | Admitting: Cardiology

## 2024-04-20 ENCOUNTER — Ambulatory Visit: Payer: Self-pay | Admitting: Cardiology

## 2024-04-29 ENCOUNTER — Other Ambulatory Visit: Payer: Self-pay | Admitting: Cardiology

## 2024-04-29 NOTE — Telephone Encounter (Signed)
Please contact pt for future appointment. 

## 2024-05-15 ENCOUNTER — Encounter: Payer: Self-pay | Admitting: Nurse Practitioner

## 2024-05-15 DIAGNOSIS — R7989 Other specified abnormal findings of blood chemistry: Secondary | ICD-10-CM

## 2024-05-16 NOTE — Telephone Encounter (Signed)
 Can we reopen this referral?

## 2024-05-16 NOTE — Progress Notes (Deleted)
  Electrophysiology Office Follow up Visit Note:    Date:  05/16/2024   ID:  Joseph [name], DOB 1961/03/17, MRN [id number]  PCP:  Valerio Melanie DASEN, NP  [facility] Cardiologist:  None  [facility] Electrophysiologist:  [name], MD    Interval History:     Joseph [name] is a 63 y.o. male who presents for a follow up visit.   The patient was last seen by me November 12, 2022.  He has atrial fibrillation. He had a catheter ablation March 23, 2023.  During the procedure, the pulmonary veins and posterior wall were ablated.  He is currently prescribed Eliquis  and amiodarone  100 mg by mouth every day.       Past medical, surgical, social and family history were reviewed.  ROS:   Please see the history of present illness.    All other systems reviewed and are negative.  EKGs/Labs/Other Studies Reviewed:    The following studies were reviewed today:          Physical Exam:    VS:  There were no vitals taken for this visit.    Wt Readings from Last 3 Encounters:  03/20/23 275 lb (124.7 kg)  03/17/23 276 lb (125.2 kg)  11/12/22 270 lb (122.5 kg)     GEN: no distress CARD: RRR, No MRG RESP: No IWOB. CTAB.      ASSESSMENT:    No diagnosis found. PLAN:    In order of problems listed above:  #Persistent atrial fibrillation #High risk medication monitoring-amiodarone  Doing well after catheter ablation March 23, 2023*** Stop amiodarone *** Continue Eliquis   I discussed my upcoming departure from [facility] during today's clinic appointment.  The patient will continue to follow-up with one of my EP partners moving forward.  Follow-up with EP APP in 6 months    Signed, Ole Holts, MD, [facility], [facility] 05/16/2024 8:18 AM    Electrophysiology [facility]

## 2024-05-18 ENCOUNTER — Ambulatory Visit: Payer: Self-pay | Admitting: Cardiology

## 2024-05-18 DIAGNOSIS — Z79899 Other long term (current) drug therapy: Secondary | ICD-10-CM

## 2024-05-18 DIAGNOSIS — I4819 Other persistent atrial fibrillation: Secondary | ICD-10-CM

## 2024-05-23 NOTE — Progress Notes (Deleted)
°  Electrophysiology Office Follow up Visit Note:    Date:  05/23/2024   ID:  Joseph [name], DOB 27-May-1961, MRN [id number]  PCP:  Valerio Melanie DASEN, NP  [facility] Cardiologist:  None  [facility] Electrophysiologist:  [name], MD    Interval History:     Joseph [name] is a 63 y.o. male who presents for a follow up visit.   The patient was last seen by me November 12, 2022.  He has atrial fibrillation. He had a catheter ablation March 23, 2023.  During the procedure, the pulmonary veins and posterior wall were ablated.  He is currently prescribed Eliquis  and amiodarone  100 mg by mouth every day.       Past medical, surgical, social and family history were reviewed.  ROS:   Please see the history of present illness.    All other systems reviewed and are negative.  EKGs/Labs/Other Studies Reviewed:    The following studies were reviewed today:          Physical Exam:    VS:  There were no vitals taken for this visit.    Wt Readings from Last 3 Encounters:  03/20/23 275 lb (124.7 kg)  03/17/23 276 lb (125.2 kg)  11/12/22 270 lb (122.5 kg)     GEN: no distress CARD: RRR, No MRG RESP: No IWOB. CTAB.      ASSESSMENT:    No diagnosis found. PLAN:    In order of problems listed above:  #Persistent atrial fibrillation #High risk medication monitoring-amiodarone  Doing well after catheter ablation March 23, 2023*** Stop amiodarone *** Continue Eliquis   I discussed my upcoming departure from [facility] during today's clinic appointment.  The patient will continue to follow-up with one of my EP partners moving forward.  Follow-up with EP APP in 6 months    Signed, Ole Holts, MD, [facility], [facility] 05/23/2024 10:20 AM    Electrophysiology [facility]

## 2024-05-25 ENCOUNTER — Ambulatory Visit: Admitting: Cardiology

## 2024-05-27 ENCOUNTER — Inpatient Hospital Stay

## 2024-05-27 ENCOUNTER — Inpatient Hospital Stay: Admitting: Oncology

## 2024-06-18 ENCOUNTER — Other Ambulatory Visit: Payer: Self-pay | Admitting: Nurse Practitioner

## 2024-06-20 NOTE — Telephone Encounter (Signed)
 Called patient and left a message to call back to get scheduled.

## 2024-06-24 NOTE — Telephone Encounter (Signed)
 Called patient and left a message to call back to get scheduled.

## 2024-06-28 NOTE — Telephone Encounter (Signed)
 3rd attempt call to patient to schedule overdue office visit. Lvm for patient to call office.

## 2024-07-04 ENCOUNTER — Inpatient Hospital Stay: Payer: Self-pay

## 2024-07-04 ENCOUNTER — Inpatient Hospital Stay: Payer: Self-pay | Admitting: Oncology

## 2024-07-08 ENCOUNTER — Inpatient Hospital Stay: Payer: Self-pay

## 2024-07-08 ENCOUNTER — Inpatient Hospital Stay: Payer: Self-pay | Admitting: Oncology

## 2024-07-11 ENCOUNTER — Telehealth: Payer: Self-pay | Admitting: Cardiology

## 2024-07-11 NOTE — Telephone Encounter (Signed)
" °*  STAT* If patient is at the pharmacy, call can be transferred to refill team.   1. Which medications need to be refilled? (please list name of each medication and dose if known)   ELIQUIS  5 MG TABS tablet      2. Would you like to learn more about the convenience, safety, & potential cost savings by using the [facility]? no   3. Are you open to using the [facility] (Type [facility]. no   4. Which pharmacy/location (including street and city if local pharmacy) is medication to be sent to? [id number] [address], [place] [phone]      5. Do they need a 30 day or 90 day supply? 90 day   "

## 2024-07-12 MED ORDER — APIXABAN 5 MG PO TABS: 5.0000 mg | ORAL_TABLET | Freq: Two times a day (BID) | ORAL | 0 refills | Status: AC

## 2024-07-12 NOTE — Telephone Encounter (Signed)
 Pt not seen since 11/2022, has appt scheduled 07/14/24, will send in a 7 day supply to get pt to appointment.   Prescription refill request for Eliquis  received. Indication: AFIB Last office visit: 11/2022 Scr:  0.86 (09/2023) Age: 64 Weight: 124.7 KG

## 2024-07-13 ENCOUNTER — Inpatient Hospital Stay: Payer: Self-pay

## 2024-07-13 ENCOUNTER — Inpatient Hospital Stay: Payer: Self-pay | Admitting: Oncology

## 2024-07-14 ENCOUNTER — Other Ambulatory Visit: Payer: Self-pay | Admitting: Nurse Practitioner

## 2024-07-14 ENCOUNTER — Ambulatory Visit

## 2024-07-14 NOTE — Progress Notes (Unsigned)
" °  Cardiology Office Note   Date:  07/14/2024  ID:  Joseph [name], DOB Mar 25, 1961, MRN [id number] PCP: Valerio Melanie DASEN, NP  [facility] Providers Cardiologist:  None Electrophysiologist:  [name], MD (Inactive) { Click to update primary MD,subspecialty MD or APP then REFRESH:1}    History of Present Illness Joseph [name] is a 64 y.o. male PMH atrial fibrillation status post ablation 03/2023, heart failure with recovered ejection fraction, TAA, secundum ASD who presents to establish care.  ***.  Last LDL 54 03/2023  Relevant CVD History -A-fib ablation 03/2023 -CT chest 02/2019 4 aortic root and TAA measuring 4.9 cm - TTE 02/2023 normal biventricular function, 48 mm aortic root, 47 mm ascending aorta, minimal CAC - TTE 09/2020 LVEF 30 to 35%, normal RV size and function, no significant valvular disease, ascending aorta 48 mm, aortic root 39 mm - TEE guided cardioversion 07/2020 LVEF 25 to 30%, moderately reduced RV function, no thrombus, mild to moderate MR, mild to moderate TR, small secundum ASD   ROS: Pt denies any chest discomfort, jaw pain, arm pain, palpitations, syncope, presyncope, orthopnea, PND, or LE edema.  Studies Reviewed I have independently reviewed the patient's ECG, previous CT scan, previous cardiac testing, previous medical records, previous blood work.  Physical Exam VS:  There were no vitals taken for this visit.       Wt Readings from Last 3 Encounters:  03/20/23 275 lb (124.7 kg)  03/17/23 276 lb (125.2 kg)  11/12/22 270 lb (122.5 kg)    GEN: No acute distress. NECK: No JVD; No carotid bruits. CARDIAC: ***RRR, no murmurs, rubs, gallops. RESPIRATORY:  Clear to auscultation. EXTREMITIES:  Warm and well-perfused. No edema.  ASSESSMENT AND PLAN Atrial fibrillation status post ablation Heart failure with recovered ejection fraction TAA Secundum ASD CAC HLD        {Are you ordering a CV Procedure (e.g. stress test, cath,  DCCV, TEE, etc)?   Press F2        :[id number]}  Dispo: ***  Signed, Caron Poser, MD  "

## 2024-07-15 NOTE — Telephone Encounter (Signed)
 Requested medications are due for refill today.  yes  Requested medications are on the active medications list.  yes  Last refill. 06/20/2024 #30 0 rf  Future visit scheduled.   yes  Notes to clinic.  Expired labs.    Requested Prescriptions  Pending Prescriptions Disp Refills   rosuvastatin  (CRESTOR ) 10 MG tablet [Pharmacy Med Name: ROSUVASTATIN  CALCIUM  10 MG TAB] 90 tablet 1    Sig: TAKE 1 TABLET BY MOUTH DAILY. NEED VISIT FOR FURTHER REFILLS.     Cardiovascular:  Antilipid - Statins 2 Failed - 07/15/2024  3:18 PM      Failed - Cr in normal range and within 360 days    Creatinine, Ser  Date Value Ref Range Status  03/17/2023 0.90 0.76 - 1.27 mg/dL Final         Failed - Valid encounter within last 12 months    Recent Outpatient Visits   None     Future Appointments             In 4 days Argentina Clap, MD [facility]            Failed - Lipid Panel in normal range within the last 12 months    Cholesterol, Total  Date Value Ref Range Status  03/17/2023 128 100 - 199 mg/dL Final   LDL Chol Calc (NIH)  Date Value Ref Range Status  03/17/2023 54 0 - 99 mg/dL Final   HDL  Date Value Ref Range Status  03/17/2023 60 >39 mg/dL Final   Triglycerides  Date Value Ref Range Status  03/17/2023 69 0 - 149 mg/dL Final         Passed - Patient is not pregnant

## 2024-07-19 ENCOUNTER — Inpatient Hospital Stay: Admitting: Oncology

## 2024-07-19 ENCOUNTER — Ambulatory Visit

## 2024-07-19 ENCOUNTER — Inpatient Hospital Stay

## 2024-08-02 ENCOUNTER — Encounter: Admitting: Nurse Practitioner
